# Patient Record
Sex: Male | Born: 1985 | Race: Black or African American | Hispanic: No | Marital: Single | Smoking: Never smoker
Health system: Southern US, Community
[De-identification: ages and names within clinical notes are randomized; demographics above are authoritative.]

---

## 2016-11-16 ENCOUNTER — Emergency Department (HOSPITAL_COMMUNITY): Payer: No Typology Code available for payment source

## 2016-11-16 ENCOUNTER — Emergency Department (HOSPITAL_COMMUNITY)
Admission: EM | Admit: 2016-11-16 | Discharge: 2016-11-16 | Disposition: A | Discharge status: Home / Self Care | Payer: No Typology Code available for payment source | Attending: Emergency Medicine | Admitting: Emergency Medicine

## 2016-11-16 DIAGNOSIS — M546 Pain in thoracic spine: Secondary | ICD-10-CM | POA: Insufficient documentation

## 2016-11-16 DIAGNOSIS — M25511 Pain in right shoulder: Secondary | ICD-10-CM | POA: Insufficient documentation

## 2016-11-16 DIAGNOSIS — Y999 Unspecified external cause status: Secondary | ICD-10-CM | POA: Diagnosis not present

## 2016-11-16 DIAGNOSIS — M549 Dorsalgia, unspecified: Secondary | ICD-10-CM

## 2016-11-16 DIAGNOSIS — Y92410 Unspecified street and highway as the place of occurrence of the external cause: Secondary | ICD-10-CM | POA: Diagnosis not present

## 2016-11-16 DIAGNOSIS — Y939 Activity, unspecified: Secondary | ICD-10-CM | POA: Diagnosis not present

## 2016-11-16 MED ORDER — IBUPROFEN 600 MG PO TABS: 600.0000 mg | ORAL_TABLET | Freq: Four times a day (QID) | ORAL | 0 refills | Status: DC | PRN

## 2016-11-16 MED ORDER — METHOCARBAMOL 500 MG PO TABS: 500.0000 mg | ORAL_TABLET | Freq: Every evening | ORAL | 0 refills | Status: DC | PRN

## 2016-11-16 MED ORDER — METHOCARBAMOL 500 MG PO TABS
1000.0000 mg | ORAL_TABLET | Freq: Once | ORAL | Status: AC
  Administered 2016-11-16: 1000 mg via ORAL
  Filled 2016-11-16: qty 2

## 2016-11-16 MED ORDER — IBUPROFEN 200 MG PO TABS
600.0000 mg | ORAL_TABLET | Freq: Once | ORAL | Status: AC
  Administered 2016-11-16: 600 mg via ORAL
  Filled 2016-11-16: qty 3

## 2016-11-16 NOTE — ED Notes (Signed)
History and screening reviews delayed d/t Pt being on the phone.

## 2016-11-16 NOTE — ED Notes (Signed)
Bed: WTR7 Expected date:  Expected time:  Means of arrival:  Comments: EMS 

## 2016-11-16 NOTE — ED Notes (Addendum)
Verbalized understanding discharge instructions, prescriptions, and follow up. In no acute distress.  Pt reports he will be walking home d/t weather.  Pt sts "my phone says that it's a minute walk."
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
## 2016-11-16 NOTE — Discharge Instructions (Signed)
Take anti-inflammatory medicines for the next week. Take this medicine with food. °Take muscle relaxer at bedtime to help you sleep. This medicine makes you drowsy so do not take before driving or work °Use a heating pad for sore muscles - use for 20 minutes several times a day ° °

## 2016-11-16 NOTE — ED Notes (Signed)
Pt dropped 1- 200mg  ibuprofen tablet, so a 4th tablet had to be pulled from Pyxis.

## 2018-03-21 ENCOUNTER — Other Ambulatory Visit: Payer: Self-pay

## 2018-03-21 ENCOUNTER — Encounter (HOSPITAL_COMMUNITY): Payer: Self-pay

## 2018-03-21 DIAGNOSIS — B001 Herpesviral vesicular dermatitis: Secondary | ICD-10-CM | POA: Insufficient documentation

## 2018-03-21 NOTE — ED Triage Notes (Signed)
States while taking a shower tonight he noticed bumps on right side of mouth with burning, states his scrotum is gray and it is not usual.

## 2018-03-22 ENCOUNTER — Emergency Department (HOSPITAL_COMMUNITY)
Admission: EM | Admit: 2018-03-22 | Discharge: 2018-03-22 | Disposition: A | Discharge status: Home / Self Care | Payer: Self-pay | Attending: Emergency Medicine | Admitting: Emergency Medicine

## 2018-03-22 DIAGNOSIS — B001 Herpesviral vesicular dermatitis: Secondary | ICD-10-CM

## 2018-03-22 LAB — HIV ANTIBODY (ROUTINE TESTING W REFLEX): HIV Screen 4th Generation wRfx: NONREACTIVE

## 2018-03-22 LAB — RPR: RPR: NONREACTIVE

## 2018-03-22 MED ORDER — VALACYCLOVIR HCL 500 MG PO TABS
500.0000 mg | ORAL_TABLET | Freq: Once | ORAL | Status: AC
  Administered 2018-03-22: 500 mg via ORAL
  Filled 2018-03-22: qty 1

## 2018-03-22 MED ORDER — VALACYCLOVIR HCL 1 G PO TABS: ORAL_TABLET | ORAL | 0 refills | Status: AC

## 2018-03-22 NOTE — ED Notes (Signed)
Pt given apple juice and is aware he needs a urine sample

## 2018-03-22 NOTE — ED Provider Notes (Signed)
[facility] Provider Note: Lowella Dell, MD, FACEP  CSN: [id number] MRN: [id number] ARRIVAL: 03/21/18 at 2241 ROOM: WA09/WA09   CHIEF COMPLAINT  Mouth Lesions   HISTORY OF PRESENT ILLNESS  03/22/18 3:05 AM William Pineda is a 32 y.o. male with a 1 day history of lesions around the right side of his mouth.  The lesions are burning "really bad", worse with palpation.  He has no history of cold sores.  He denies other symptoms apart from believing his scrotal skin is gray.  He would like to be tested for "all STDs".   History reviewed. No pertinent past medical history.  History reviewed. No pertinent surgical history.  History reviewed. No pertinent family history.  Social History   Tobacco Use  . Smoking status: Never Smoker  . Smokeless tobacco: Never Used  Substance Use Topics  . Alcohol use: Never    Frequency: Never  . Drug use: Never    Prior to Admission medications   Medication Sig Start Date End Date Taking? Authorizing Provider  ibuprofen (ADVIL,MOTRIN) 600 MG tablet Take 1 tablet (600 mg total) by mouth every 6 (six) hours as needed. 11/16/16   Bethel Born, PA-C  methocarbamol (ROBAXIN) 500 MG tablet Take 1 tablet (500 mg total) by mouth at bedtime and may repeat dose one time if needed. 11/16/16   Bethel Born, PA-C    Allergies Patient has no known allergies.   REVIEW OF SYSTEMS  Negative except as noted here or in the History of Present Illness.   PHYSICAL EXAMINATION  Initial Vital Signs Blood pressure 126/90, pulse (!) 58, temperature 98.5 F (36.9 C), temperature source Oral, resp. rate 18, height  (1.753 m), weight 81.6 kg (180 lb), SpO2 98 %.  Examination General: Well-developed, well-nourished male in no acute distress; appearance consistent with age of record HENT: normocephalic; atraumatic Eyes: Normal appearance Neck: supple Heart: regular rate and rhythm Lungs: clear to auscultation bilaterally Abdomen: soft;  nondistended; nontender GU: Tanner V male, circumcised; no urethral discharge; no abnormal skin lesions or discoloration seen Extremities: No deformity; full range of motion; pulses normal Neurologic: Awake, alert and oriented; motor function intact in all extremities and symmetric; no facial droop Skin: Warm and dry; right-sided perioral lesions both vesicular and macular Psychiatric: Flat affect   RESULTS  Summary of this visit's results, reviewed by myself:   EKG Interpretation  Date/Time:    Ventricular Rate:    PR Interval:    QRS Duration:   QT Interval:    QTC Calculation:   R Axis:     Text Interpretation:        Laboratory Studies: No results found for this or any previous visit (from the past 24 hour(s)). Imaging Studies: No results found.  ED COURSE and MDM  Nursing notes and initial vitals signs, including pulse oximetry, reviewed.  Vitals:   03/21/18 2249 03/21/18 2250 03/22/18 0143 03/22/18 0314  BP: 135/78  126/90 (!) 142/92  Pulse: 73  (!) 58 70  Resp: Temp: 98.5 F (36.9 C)     TempSrc: Oral     SpO2: 99%  98% 97%  Weight:  81.6 kg (180 lb)    Height:   (1.753 m)     Suspect herpes labialis and will treat for this.  Patient will be tested for STDs as requested.  PROCEDURES    ED DIAGNOSES     ICD-10-CM   1. Cold sore B00.1  Devlin Mcveigh, Jonny Ruiz, MD 03/22/18 [phone]

## 2018-03-23 LAB — GC/CHLAMYDIA PROBE AMP (~~LOC~~) NOT AT ARMC
CHLAMYDIA, DNA PROBE: NEGATIVE
NEISSERIA GONORRHEA: NEGATIVE

## 2018-08-23 ENCOUNTER — Emergency Department (HOSPITAL_COMMUNITY)
Admission: EM | Admit: 2018-08-23 | Discharge: 2018-08-24 | Disposition: A | Discharge status: Home / Self Care | Payer: Self-pay | Attending: Emergency Medicine | Admitting: Emergency Medicine

## 2018-08-23 DIAGNOSIS — N4889 Other specified disorders of penis: Secondary | ICD-10-CM | POA: Insufficient documentation

## 2018-08-23 DIAGNOSIS — Z5321 Procedure and treatment not carried out due to patient leaving prior to being seen by health care provider: Secondary | ICD-10-CM | POA: Insufficient documentation

## 2018-08-24 ENCOUNTER — Encounter (HOSPITAL_COMMUNITY): Payer: Self-pay

## 2018-08-24 ENCOUNTER — Other Ambulatory Visit: Payer: Self-pay

## 2018-08-24 LAB — BASIC METABOLIC PANEL
ANION GAP: 10 (ref 5–15)
BUN: 11 mg/dL (ref 6–20)
CHLORIDE: 106 mmol/L (ref 98–111)
CO2: 23 mmol/L (ref 22–32)
CREATININE: 0.85 mg/dL (ref 0.61–1.24)
Calcium: 9.5 mg/dL (ref 8.9–10.3)
GFR calc non Af Amer: >60 mL/min (ref >60)
Glucose, Bld: 119 mg/dL — ABNORMAL HIGH (ref 70–99)
POTASSIUM: 3.6 mmol/L (ref 3.5–5.1)
SODIUM: 139 mmol/L (ref 135–145)

## 2018-08-24 LAB — URINALYSIS, ROUTINE W REFLEX MICROSCOPIC
BILIRUBIN URINE: NEGATIVE
Glucose, UA: NEGATIVE mg/dL
Ketones, ur: NEGATIVE mg/dL
LEUKOCYTES UA: NEGATIVE
NITRITE: NEGATIVE
Protein, ur: NEGATIVE mg/dL
SPECIFIC GRAVITY, URINE: 1.03 (ref 1.005–1.030)
pH: 5 (ref 5.0–8.0)

## 2018-08-24 LAB — CBC
HCT: 45.4 % (ref 39.0–52.0)
HEMOGLOBIN: 14.9 g/dL (ref 13.0–17.0)
MCH: 29.2 pg (ref 26.0–34.0)
MCHC: 32.8 g/dL (ref 30.0–36.0)
MCV: 88.8 fL (ref 80.0–100.0)
NRBC: 0 % (ref 0.0–0.2)
Platelets: 296 10*3/uL (ref 150–400)
RBC: 5.11 MIL/uL (ref 4.22–5.81)
RDW: 11.9 % (ref 11.5–15.5)
WBC: 7.1 10*3/uL (ref 4.0–10.5)

## 2018-08-24 NOTE — ED Triage Notes (Signed)
Pt here after having bright red blood happen with an ejaculation after intercourse.  States he has never had this happen before and when he urinates there is no blood.  No burning with urination or frequency, does states there was a weird smell.

## 2018-08-24 NOTE — ED Notes (Signed)
Pt. Stated "I'm gonna have to head out because I have work in the morning". This tech told him he shpuld stay for his medical care but he declined. Moving him off the floor.

## 2019-05-02 ENCOUNTER — Other Ambulatory Visit: Payer: Self-pay

## 2019-05-02 ENCOUNTER — Ambulatory Visit (HOSPITAL_COMMUNITY)
Admission: EM | Admit: 2019-05-02 | Discharge: 2019-05-02 | Disposition: A | Discharge status: Home / Self Care | Payer: Self-pay | Attending: Family Medicine | Admitting: Family Medicine

## 2019-05-02 ENCOUNTER — Encounter (HOSPITAL_COMMUNITY): Payer: Self-pay | Admitting: Emergency Medicine

## 2019-05-02 DIAGNOSIS — S3730XA Unspecified injury of urethra, initial encounter: Secondary | ICD-10-CM | POA: Insufficient documentation

## 2019-05-02 LAB — POCT URINALYSIS DIP (DEVICE)
Bilirubin Urine: NEGATIVE
Glucose, UA: NEGATIVE mg/dL
Leukocytes,Ua: NEGATIVE
Nitrite: NEGATIVE
Protein, ur: NEGATIVE mg/dL
Specific Gravity, Urine: >=1.03 (ref 1.005–1.030)
Urobilinogen, UA: 1 mg/dL (ref 0.0–1.0)
pH: 5.5 (ref 5.0–8.0)

## 2019-05-02 NOTE — Discharge Instructions (Addendum)
Please hold off on all intercourse and monitor for bleeding and pain to gradually resolve Please follow up if pain/bleeding persisting as you may need to see urology- contact below We will check the urine for STDs

## 2019-05-02 NOTE — ED Triage Notes (Signed)
Pt states during intercourse he felt a squeezing or rubbing to his penis.  When he ejaculated he saw blood in it and then he states he saw blood in his urine after. This happened just a few hours ago.  He is still having pain to the head of his penis. He denies any other urinary symptoms.
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
## 2019-05-07 LAB — URINE CYTOLOGY ANCILLARY ONLY
Chlamydia: NEGATIVE
Neisseria Gonorrhea: NEGATIVE
Trichomonas: POSITIVE — AB

## 2019-05-09 ENCOUNTER — Telehealth (HOSPITAL_COMMUNITY): Payer: Self-pay | Admitting: Emergency Medicine

## 2019-05-09 MED ORDER — METRONIDAZOLE 500 MG PO TABS: 2000.0000 mg | ORAL_TABLET | Freq: Once | ORAL | 0 refills | Status: AC

## 2019-05-09 NOTE — Telephone Encounter (Signed)
Trichomonas is positive. Rx  for Flagyl 2 grams, once was sent to the pharmacy of record. Pt needs education to refrain from sexual intercourse for 7 days to give the medicine time to work. Sexual partners need to be notified and tested/treated. Condoms may reduce risk of reinfection. Recheck for further evaluation if symptoms are not improving.   Patient contacted and made aware of all results, all questions answered.   

## 2019-05-10 ENCOUNTER — Telehealth (HOSPITAL_COMMUNITY): Payer: Self-pay | Admitting: Emergency Medicine

## 2019-05-10 MED ORDER — METRONIDAZOLE 500 MG PO TABS: 2000.0000 mg | ORAL_TABLET | Freq: Once | ORAL | 0 refills | Status: AC

## 2019-05-10 MED ORDER — ONDANSETRON 4 MG PO TBDP: 4.0000 mg | ORAL_TABLET | Freq: Once | ORAL | 0 refills | Status: AC

## 2019-05-10 NOTE — Telephone Encounter (Signed)
Pt called asking for refill on the flagyl, states he threw up two of the pills. Okay to send to pharmacy per Lanelle Bal, will send one pill for nausea as well.

## 2020-04-04 ENCOUNTER — Emergency Department (HOSPITAL_COMMUNITY)
Admission: EM | Admit: 2020-04-04 | Discharge: 2020-04-04 | Disposition: A | Discharge status: Home / Self Care | Payer: Self-pay | Attending: Emergency Medicine | Admitting: Emergency Medicine

## 2020-04-04 ENCOUNTER — Encounter (HOSPITAL_COMMUNITY): Payer: Self-pay | Admitting: Emergency Medicine

## 2020-04-04 ENCOUNTER — Emergency Department (HOSPITAL_COMMUNITY): Payer: Self-pay

## 2020-04-04 DIAGNOSIS — M79641 Pain in right hand: Secondary | ICD-10-CM

## 2020-04-04 NOTE — Discharge Instructions (Signed)
Your xrays did not show any signs of fractures today. You are likely experiencing pain related to a soft tissue injury. Keep ace wrap on to help with compression. While at home please rest, ice, and elevate your wrist to reduce pain/swelling. You can take Ibuprofen and Tylenol as needed for pain.   If no improvement in about 1 week please follow up with Dr. Merlyn Lot for further evaluation

## 2020-04-04 NOTE — ED Triage Notes (Signed)
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

## 2020-04-04 NOTE — ED Provider Notes (Signed)
[facility]-EMERGENCY DEPT Provider Note   CSN: [id number] Arrival date & time: 04/04/20  1025     History Chief Complaint  Patient presents with  . Hand Pain    William Pineda is a 34 y.o. male who is R hand dominant presents to the ED today with complaint of gradual onset, constant, achy, R hand pain that began yesterday. Pt is a Curator and works on cars.  He states that he was working on a car yesterday and taking off nuts and bolts from the car with a tool.  He states that shortly afterwards he began having pain in the palmar aspect of his right hand.  Trauma to the hand.  He denies anything falling on it.  He tried to apply ice without relief.  He has not taken any over-the-counter medications.  He went to work today however states worsening pain and his boss wanted him to come and get checked out.  States the pain is exacerbated with full extension of all fingers spread apart.   The history is provided by the patient and medical records.       History reviewed. No pertinent past medical history.  There are no problems to display for this patient.   History reviewed. No pertinent surgical history.     No family history on file.  Social History   Tobacco Use  . Smoking status: Never Smoker  . Smokeless tobacco: Never Used  Substance Use Topics  . Alcohol use: Never  . Drug use: Never    Home Medications Prior to Admission medications   Medication Sig Start Date End Date Taking? Authorizing Provider  valACYclovir (VALTREX) 1000 MG tablet Take 1 tablet at 4 PM today. 03/22/18   Molpus, Jonny Ruiz, MD    Allergies    Patient has no known allergies.  Review of Systems   Review of Systems  Constitutional: Negative for chills and fever.  Musculoskeletal: Positive for arthralgias and joint swelling.  Skin: Negative for wound.  Neurological: Negative for weakness and numbness.    Physical Exam Updated Vital Signs BP 133/84 (BP Location: Right Arm)    Pulse 86   Temp 98.4 F (36.9 C) (Oral)   Resp 18   SpO2 98%   Physical Exam Vitals and nursing note reviewed.  Constitutional:      Appearance: He is not ill-appearing or diaphoretic.  HENT:     Head: Normocephalic and atraumatic.  Eyes:     Conjunctiva/sclera: Conjunctivae normal.  Cardiovascular:     Rate and Rhythm: Normal rate and regular rhythm.     Pulses: Normal pulses.  Pulmonary:     Effort: Pulmonary effort is normal.     Breath sounds: Normal breath sounds. No wheezing, rhonchi or rales.  Musculoskeletal:     Comments: Mild swelling noted to palmar aspect of R hand along thenar imminence with TTP. No overlying skin changes. No tenderness to wrist, MCP, PIP, and DIP joints of all fingers. Cap refill < 2 seconds. 2+ radial pulses.   Skin:    General: Skin is warm and dry.     Coloration: Skin is not jaundiced.  Neurological:     Mental Status: He is alert.     ED Results / Procedures / Treatments   Labs (all labs ordered are listed, but only abnormal results are displayed) Labs Reviewed - No data to display  EKG None  Radiology DG Wrist Complete Right  Result Date: 04/04/2020 CLINICAL DATA:  Patient was working  on a car yesterday now complaining of anterior right hand and wrist pain. EXAM: RIGHT WRIST - COMPLETE 3+ VIEW COMPARISON:  None. FINDINGS: There is no evidence of fracture or dislocation. There is no evidence of arthropathy or other focal bone abnormality. Soft tissues are unremarkable. IMPRESSION: Negative. Electronically Signed   By: Lajean Manes M.D.   On: 04/04/2020 11:31   DG Hand Complete Right  Result Date: 04/04/2020 CLINICAL DATA:  Patient was working on a car yesterday now complaining of anterior right hand and wrist pain. EXAM: RIGHT HAND - COMPLETE 3+ VIEW COMPARISON:  None. FINDINGS: There is no evidence of fracture or dislocation. There is no evidence of arthropathy or other focal bone abnormality. Soft tissues are unremarkable. IMPRESSION:  Negative. Electronically Signed   By: Lajean Manes M.D.   On: 04/04/2020 11:32    Procedures Procedures (including critical care time)  Medications Ordered in ED Medications - No data to display  ED Course  I have reviewed the triage vital signs and the nursing notes.  Pertinent labs & imaging results that were available during my care of the patient were reviewed by me and considered in my medical decision making (see chart for details).    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  Pain is worsened with extension of all fingers.  Able to make a fist without difficulty.  Range of motion intact to wrist and all fingers.  He does have some swelling and tenderness noted to the thenar eminence.  Neurovascularly intact.  X-rays of the wrist and hand were obtained prior to being seen which were negative for fractures.  I suspect likely soft tissue injury.  Will apply Ace wrap, rice therapy discussed as well as ibuprofen/Tylenol urine for pain.  Patient to be given information for orthopedist if no improvement in 1 week.  He is in agreement with plan and stable for discharge home.   This note was prepared using Dragon voice recognition software and may include unintentional dictation errors due to the inherent limitations of voice recognition software.  Final Clinical Impression(s) / ED Diagnoses Final diagnoses:  Right hand pain    Rx / DC Orders ED Discharge Orders    None       Discharge Instructions     Your xrays did not show any signs of fractures today. You are likely experiencing pain related to a soft tissue injury. Keep ace wrap on to help with compression. While at home please rest, ice, and elevate your wrist to reduce pain/swelling. You can take  Ibuprofen and Tylenol as needed for pain.   If no improvement in about 1 week please follow up with Dr. Fredna Dow for further evaluation        Eustaquio Maize, PA-C 04/04/20 [place], MD 04/04/20 1340

## 2020-06-14 IMAGING — CR DG HAND COMPLETE 3+V*R*
3 series · 3 of 3 positions shown · non-contrast
Comparison: None.

CLINICAL DATA: Patient was working on a car yesterday now
complaining of anterior right hand and wrist pain.

EXAM:
RIGHT HAND - COMPLETE 3+ VIEW

[x hand pa right]
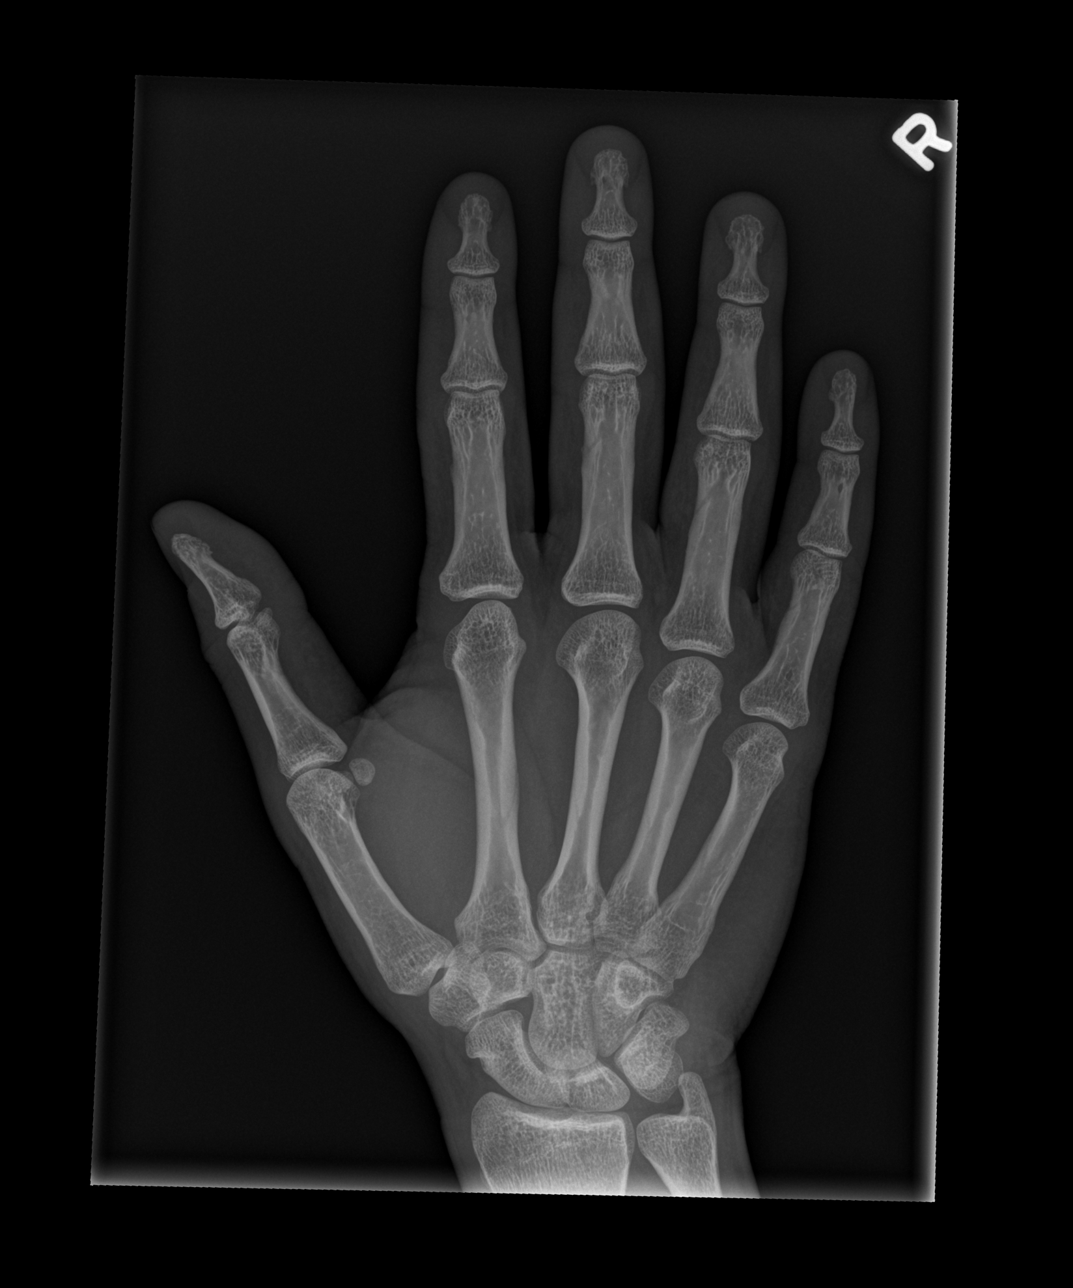

[x hand obl right]
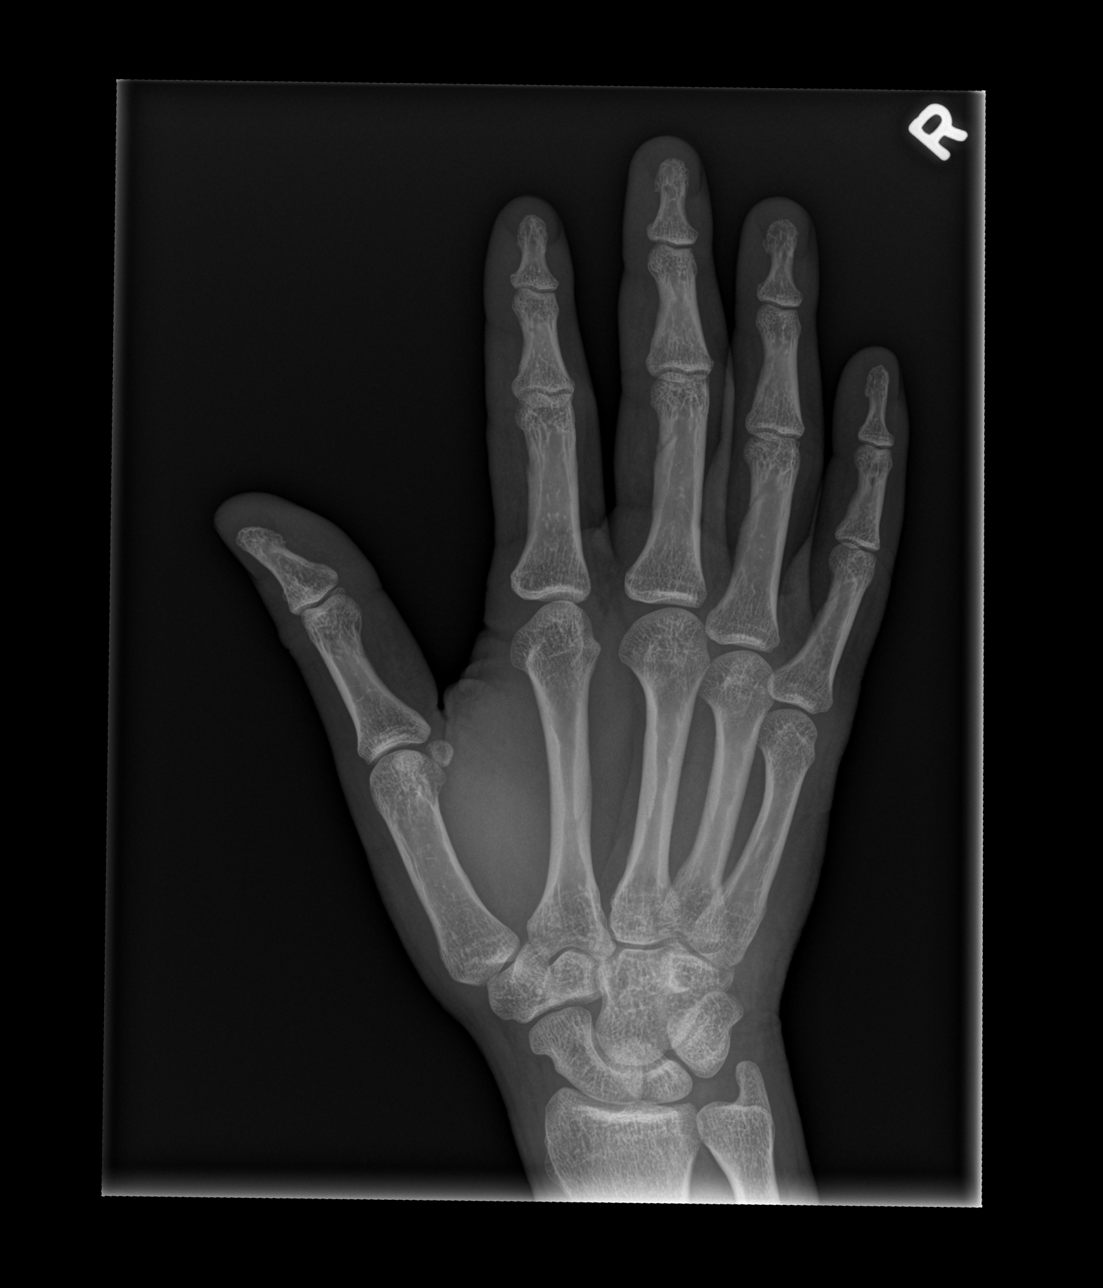

[x hand lat right]
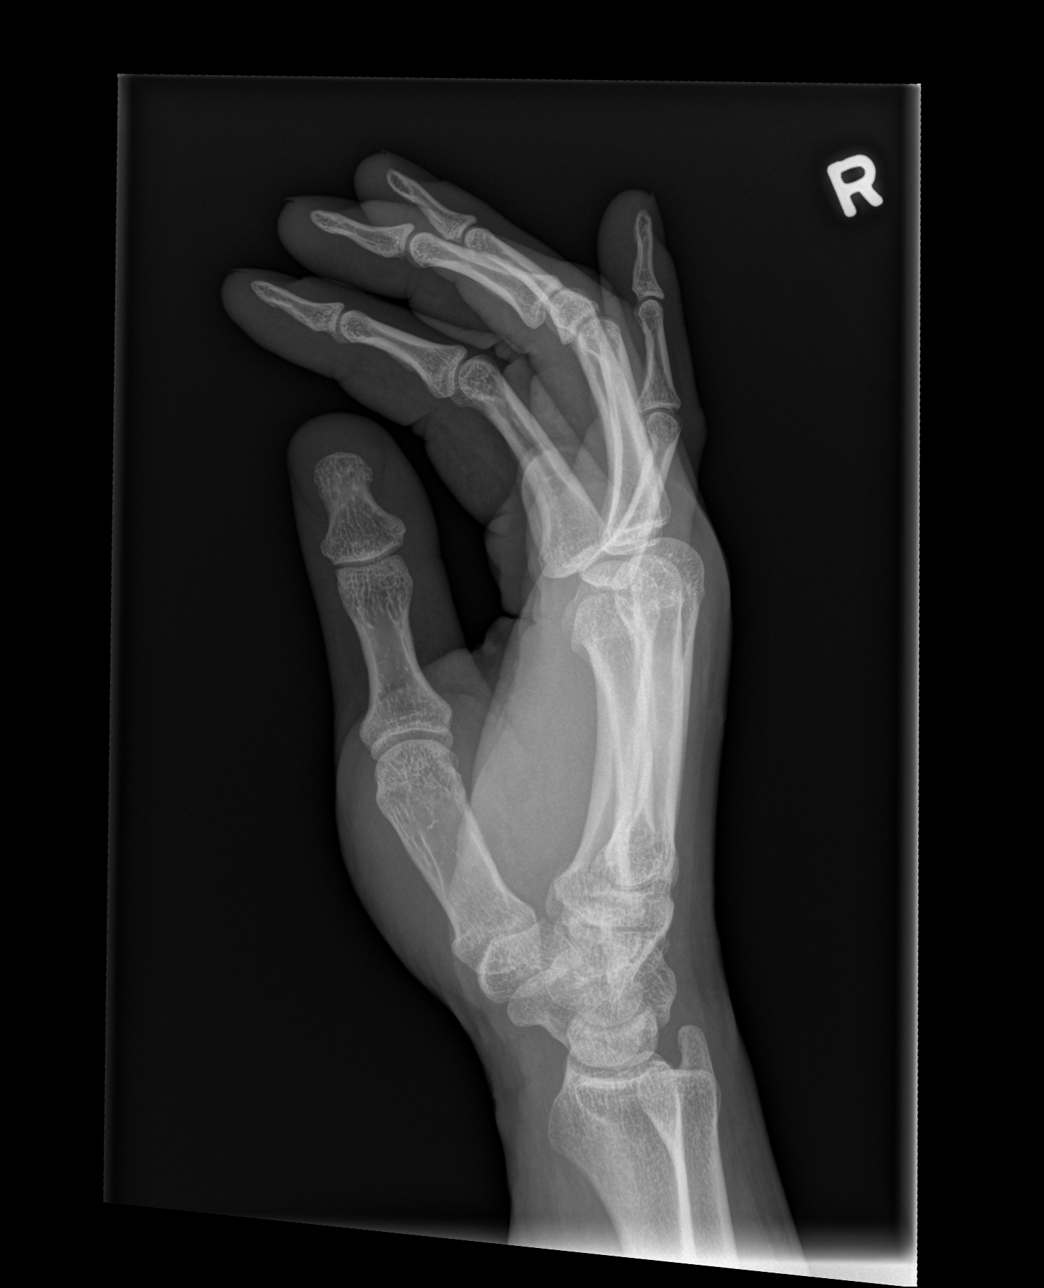

[3 of 3 positions shown; findings below may reference images not displayed]

FINDINGS: There is no evidence of fracture or dislocation. There is no
evidence of arthropathy or other focal bone abnormality. Soft
tissues are unremarkable.
IMPRESSION: Negative.

## 2020-06-14 IMAGING — CR DG WRIST COMPLETE 3+V*R*
4 series · 4 of 4 positions shown · non-contrast
Comparison: None.

CLINICAL DATA: Patient was working on a car yesterday now
complaining of anterior right hand and wrist pain.

EXAM:
RIGHT WRIST - COMPLETE 3+ VIEW

[x wrist lat right]
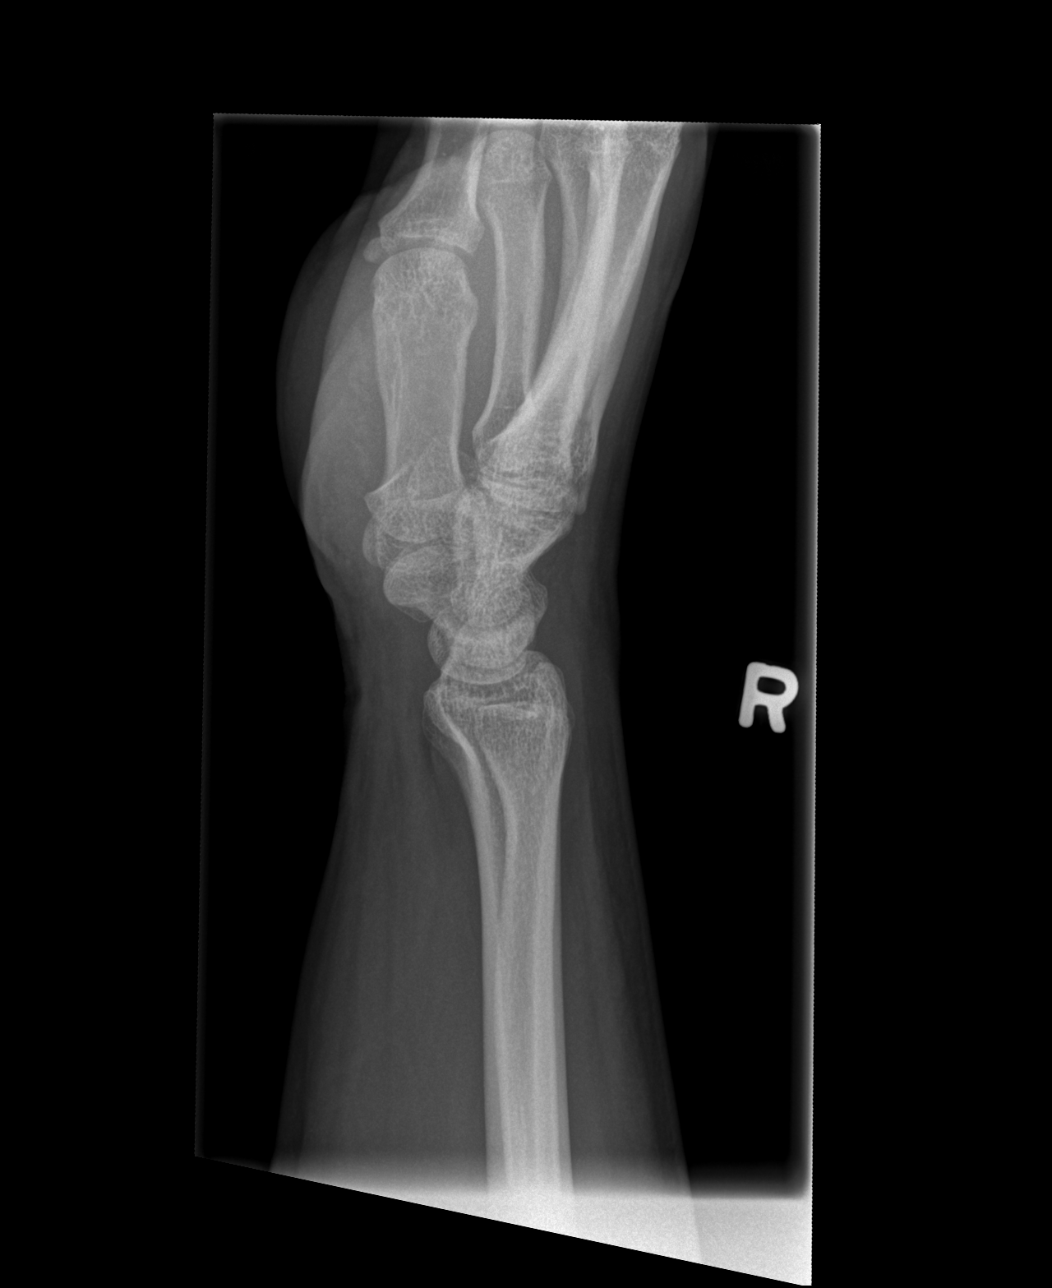

[x wrist obl right]
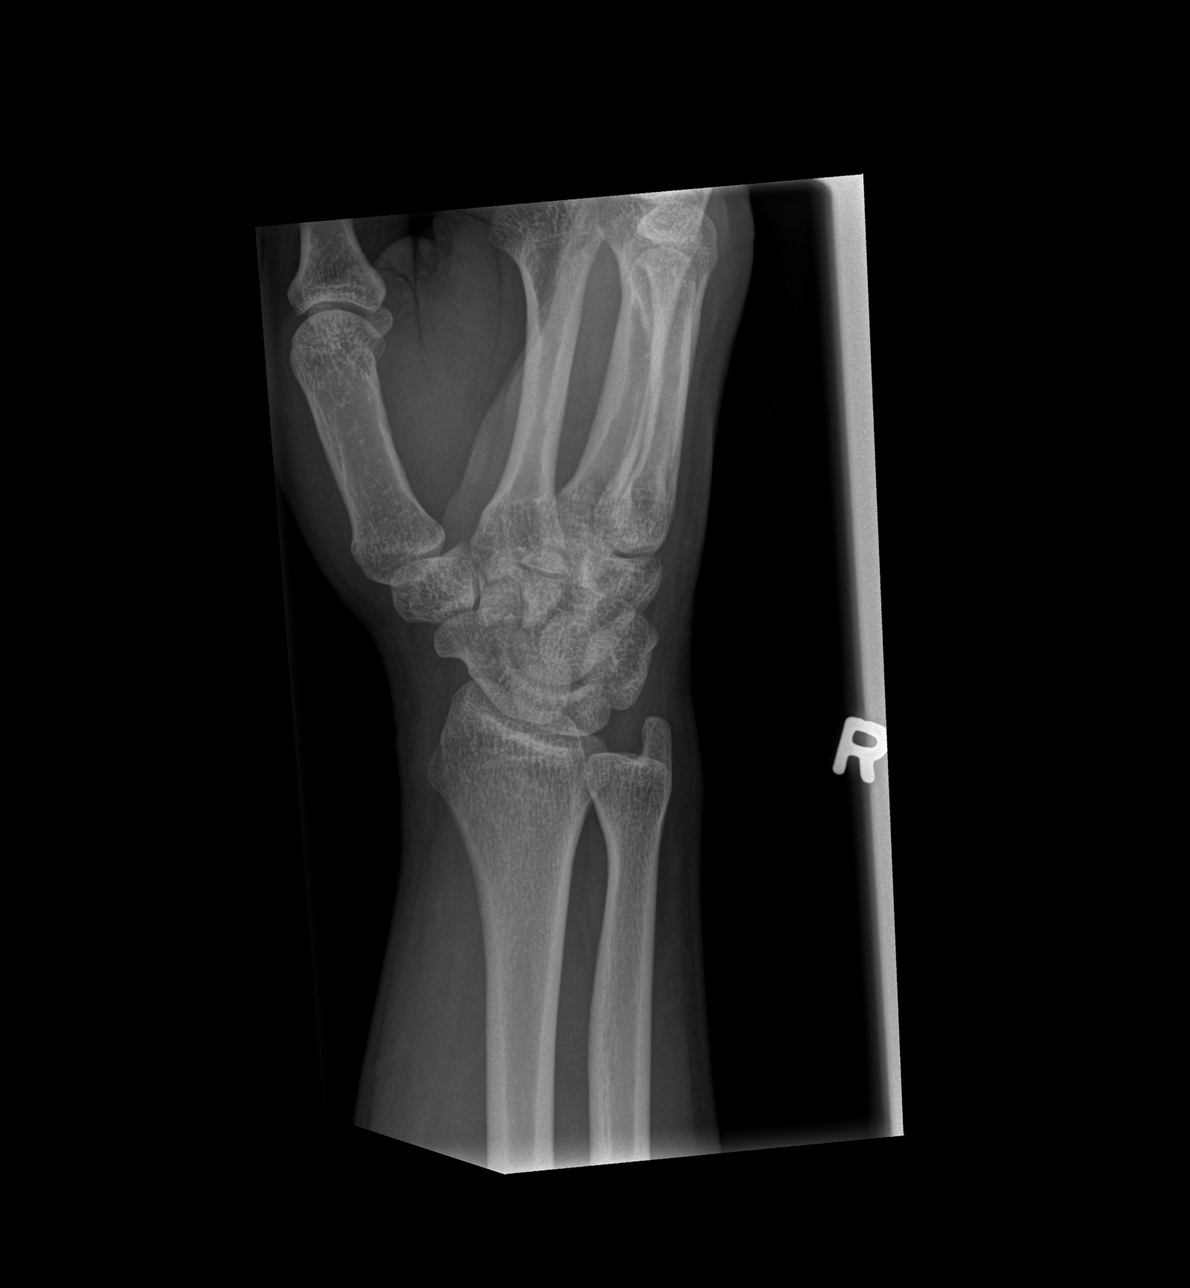

[x wrist pa right]
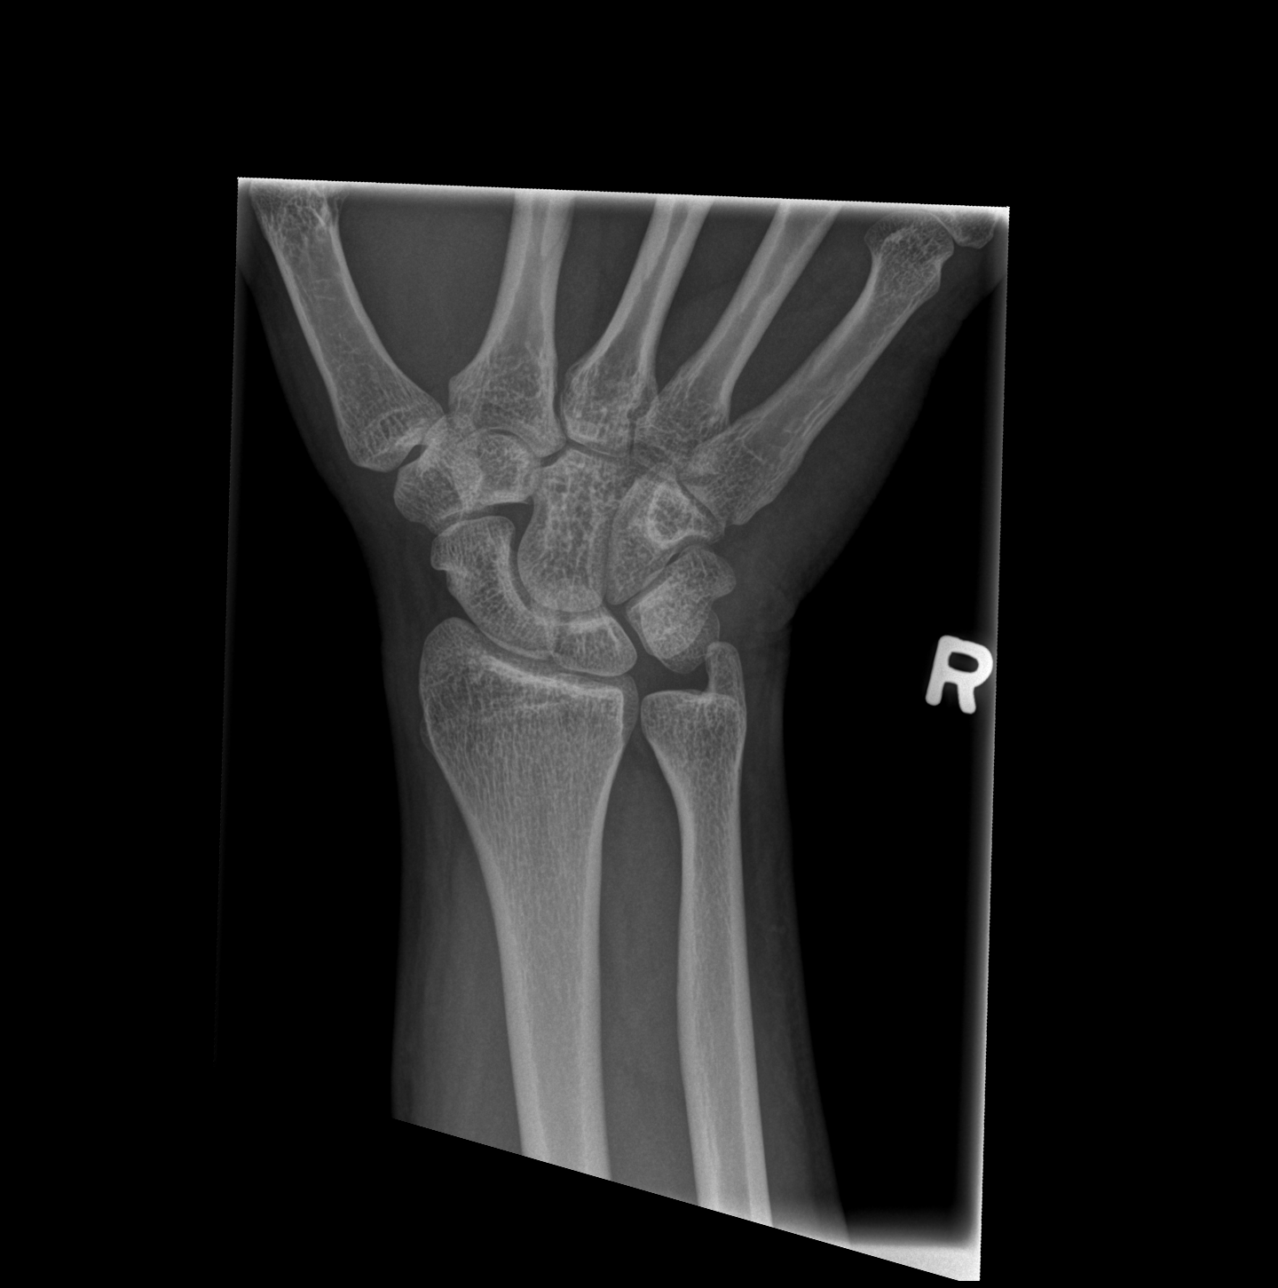

[x wrist navicular view right]
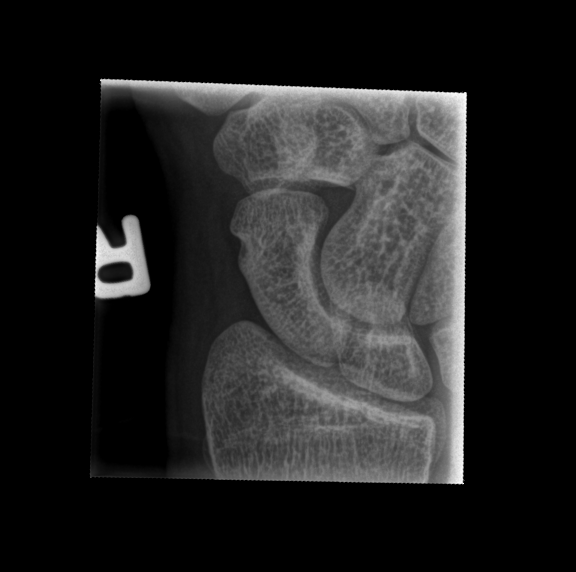

[4 of 4 positions shown; findings below may reference images not displayed]

FINDINGS: There is no evidence of fracture or dislocation. There is no
evidence of arthropathy or other focal bone abnormality. Soft
tissues are unremarkable.
IMPRESSION: Negative.

## 2020-06-27 ENCOUNTER — Encounter (HOSPITAL_COMMUNITY): Payer: Self-pay | Admitting: Student

## 2020-06-27 ENCOUNTER — Emergency Department (HOSPITAL_COMMUNITY)
Admission: EM | Admit: 2020-06-27 | Discharge: 2020-06-29 | Disposition: A | Discharge status: Home / Self Care | Payer: Self-pay | Attending: Emergency Medicine | Admitting: Emergency Medicine

## 2020-06-27 ENCOUNTER — Other Ambulatory Visit: Payer: Self-pay

## 2020-06-27 DIAGNOSIS — Z046 Encounter for general psychiatric examination, requested by authority: Secondary | ICD-10-CM

## 2020-06-27 DIAGNOSIS — R451 Restlessness and agitation: Secondary | ICD-10-CM | POA: Insufficient documentation

## 2020-06-27 DIAGNOSIS — R45851 Suicidal ideations: Secondary | ICD-10-CM | POA: Insufficient documentation

## 2020-06-27 DIAGNOSIS — Z20822 Contact with and (suspected) exposure to covid-19: Secondary | ICD-10-CM | POA: Insufficient documentation

## 2020-06-27 DIAGNOSIS — F4325 Adjustment disorder with mixed disturbance of emotions and conduct: Secondary | ICD-10-CM

## 2020-06-27 DIAGNOSIS — F4320 Adjustment disorder, unspecified: Secondary | ICD-10-CM | POA: Insufficient documentation

## 2020-06-27 LAB — COMPREHENSIVE METABOLIC PANEL
ALT: 15 U/L (ref 0–44)
AST: 24 U/L (ref 15–41)
Albumin: 4.6 g/dL (ref 3.5–5.0)
Alkaline Phosphatase: 69 U/L (ref 38–126)
Anion gap: 10 (ref 5–15)
BUN: 9 mg/dL (ref 6–20)
CO2: 22 mmol/L (ref 22–32)
Calcium: 9.6 mg/dL (ref 8.9–10.3)
Chloride: 107 mmol/L (ref 98–111)
Creatinine, Ser: 0.74 mg/dL (ref 0.61–1.24)
GFR calc Af Amer: >60 mL/min (ref >60)
GFR calc non Af Amer: >60 mL/min (ref >60)
Glucose, Bld: 92 mg/dL (ref 70–99)
Potassium: 3.8 mmol/L (ref 3.5–5.1)
Sodium: 139 mmol/L (ref 135–145)
Total Bilirubin: 1 mg/dL (ref 0.3–1.2)
Total Protein: 8.3 g/dL — ABNORMAL HIGH (ref 6.5–8.1)

## 2020-06-27 LAB — CBC
HCT: 47.7 % (ref 39.0–52.0)
Hemoglobin: 16.4 g/dL (ref 13.0–17.0)
MCH: 29.9 pg (ref 26.0–34.0)
MCHC: 34.4 g/dL (ref 30.0–36.0)
MCV: 86.9 fL (ref 80.0–100.0)
Platelets: 265 10*3/uL (ref 150–400)
RBC: 5.49 MIL/uL (ref 4.22–5.81)
RDW: 12.5 % (ref 11.5–15.5)
WBC: 5.6 10*3/uL (ref 4.0–10.5)
nRBC: 0 % (ref 0.0–0.2)

## 2020-06-27 LAB — ACETAMINOPHEN LEVEL: Acetaminophen (Tylenol), Serum: <10 ug/mL — ABNORMAL LOW (ref 10–30)

## 2020-06-27 LAB — ETHANOL: Alcohol, Ethyl (B): <10 mg/dL (ref <10)

## 2020-06-27 LAB — RAPID URINE DRUG SCREEN, HOSP PERFORMED
Amphetamines: NOT DETECTED
Barbiturates: NOT DETECTED
Benzodiazepines: NOT DETECTED
Cocaine: NOT DETECTED
Opiates: NOT DETECTED
Tetrahydrocannabinol: NOT DETECTED

## 2020-06-27 LAB — SARS CORONAVIRUS 2 BY RT PCR (HOSPITAL ORDER, PERFORMED IN ~~LOC~~ HOSPITAL LAB): SARS Coronavirus 2: NEGATIVE

## 2020-06-27 LAB — SALICYLATE LEVEL: Salicylate Lvl: <7 mg/dL — ABNORMAL LOW (ref 7.0–30.0)

## 2020-06-27 MED ORDER — ACETAMINOPHEN 325 MG PO TABS: 650.0000 mg | ORAL_TABLET | ORAL | Status: DC | PRN

## 2020-06-27 MED ORDER — ALUM & MAG HYDROXIDE-SIMETH 200-200-20 MG/5ML PO SUSP: 30.0000 mL | Freq: Four times a day (QID) | ORAL | Status: DC | PRN

## 2020-06-27 MED ORDER — ONDANSETRON HCL 4 MG PO TABS: 4.0000 mg | ORAL_TABLET | Freq: Three times a day (TID) | ORAL | Status: DC | PRN

## 2020-06-27 MED ORDER — LORAZEPAM 1 MG PO TABS
1.0000 mg | ORAL_TABLET | Freq: Once | ORAL | Status: AC
  Administered 2020-06-27: 1 mg via ORAL
  Filled 2020-06-27: qty 1

## 2020-06-27 MED ORDER — ZOLPIDEM TARTRATE 5 MG PO TABS: 5.0000 mg | ORAL_TABLET | Freq: Every evening | ORAL | Status: DC | PRN

## 2020-06-27 NOTE — BH Assessment (Signed)
Case was staffed with Money NP who recommended patient be observed and monitored.  

## 2020-06-27 NOTE — ED Notes (Signed)
Pt has dressed down and belongings are locked up. This included a pair of adidas shoes and black pants that contained some "pocket change"
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
## 2020-06-28 DIAGNOSIS — F4325 Adjustment disorder with mixed disturbance of emotions and conduct: Secondary | ICD-10-CM

## 2020-06-28 MED ORDER — TRAZODONE HCL 100 MG PO TABS: 50.0000 mg | ORAL_TABLET | Freq: Every evening | ORAL | Status: DC | PRN

## 2020-06-28 MED ORDER — HALOPERIDOL LACTATE 5 MG/ML IJ SOLN: 5.0000 mg | Freq: Three times a day (TID) | INTRAMUSCULAR | Status: DC | PRN

## 2020-06-28 MED ORDER — HALOPERIDOL 5 MG PO TABS: 5.0000 mg | ORAL_TABLET | Freq: Three times a day (TID) | ORAL | Status: DC | PRN

## 2020-06-28 NOTE — ED Notes (Signed)
Patient resting quietly, no requests or complaints at this time

## 2020-06-28 NOTE — Consult Note (Addendum)
Telepsych Consultation   Reason for Consult:  Suicidal ideations, behavior Referring Physician:  EDP Location of Patient: [facility] Location of Provider: Other: virtual  Patient Identification: William Pineda MRN:  [id number] Principal Diagnosis: AdjustKyngston Pickelsimersorder with mixed disturbance of emotions and conduct Diagnosis:  Principal Problem:   Adjustment disorder with mixed disturbance of emotions and conduct   Total Time spent with patient: 30 minutes  Subjective:   William Pineda is a 34 y.o. male patient who presented to [facility] accompanied by the police IVC for suicidal ideations and behaviors after having a argument with his roommate.  When asked why he is here today, he is very guarded and looks away from the camera. The patient tells me, "I don't even want to repeat it.  I said I wanted suicide by cop. I said I didn't care if I went to heaven or hell."  He lists provocative factors as an argument with his roommate and acknowledges he did not make the best choice.  He states he no longer feels this way and states he wants to go home to pay his bills.  However, nothing has changed with his home environment and his roommate still lives there.  He voices his discontent with being hospitalized and although he answers questions appropriately, it is apparent he is frustrated and trying to keep his emotions intact.   In addition, he does not state plans have been worked out regarding his roommates financial obligations.  Albeit, the patient states he feels better today after having a cool off period.  He denies suicidal or homicidal ideations, intent or plan; denies audible or visual hallucinations.  He denies mental health diagnosis, prior history for self harm or mutilating behaviors; inpatient psychiatric hospitalizations.  He also denies legal concerns or problems with violent acts towards others.  He I not assigned to outpatient counseling and is psychotropic medication naive.  I have also reviewed the [facility]  counseling assessment notes, he collaborates most of the information previously obtained. Per nursing notes, he has been cooperatives with the nursing staff.     HPI:  Per EDP note on arrival 06/27/2020@1430  William Pineda is a 34 y.o. male without significant past medical history who presents to the emergency department under IVC.  Patient is escorted by [employer].  Per police patient cut off the electricity and water to his home, he in his room he had an altercation, police arrived on scene and patient stated he did not care if he went to have it or how, he asked the police to kill him and also threatened to attack police.  Patient admits that he did statements about suicide.  He states he no longer feels suicidal, he states he is irritated that he is here.  He states he has been behind on rent and is at a low point in his life.  No other alleviating or aggravating factors.  He denies hallucinations or homicidal ideation.  He denies any pain.  He states he drinks alcohol occasionally, he denies drug use.  No alcohol today.   Past Psychiatric History: denies  Risk to Self:   Risk to Others:   Prior Inpatient Therapy:   Prior Outpatient Therapy:    Past Medical History: History reviewed. No pertinent past medical history. History reviewed. No pertinent surgical history. Family History: History reviewed. No pertinent family history. Family Psychiatric  History: unknown Social History:  Social History   Substance and Sexual Activity  Alcohol Use Never     Social History  Substance and Sexual Activity  Drug Use Never    Social History   Socioeconomic History   Marital status: Single    Spouse name: Not on file   Number of children: Not on file   Years of education: Not on file   Highest education level: Not on file  Occupational History   Not on file  Tobacco Use   Smoking status: Never Smoker   Smokeless tobacco: Never Used  Substance and Sexual Activity   Alcohol use: Never   Drug use:  Never   Sexual activity: Not on file  Other Topics Concern   Not on file  Social History Narrative   Not on file   Social Determinants of Health   Financial Resource Strain:    Difficulty of Paying Living Expenses: Not on file  Food Insecurity:    Worried About Running Out of Food in the Last Year: Not on file   Ran Out of Food in the Last Year: Not on file  Transportation Needs:    Lack of Transportation (Medical): Not on file   Lack of Transportation (Non-Medical): Not on file  Physical Activity:    Days of Exercise per Week: Not on file   Minutes of Exercise per Session: Not on file  Stress:    Feeling of Stress : Not on file  Social Connections:    Frequency of Communication with Friends and Family: Not on file   Frequency of Social Gatherings with Friends and Family: Not on file   Attends Religious Services: Not on file   Active Member of Clubs or Organizations: Not on file   Attends BankerClub or Organization Meetings: Not on file   Marital Status: Not on file   Additional Social History:    Allergies:  No Known Allergies  Labs:  Results for orders placed or performed during the hospital encounter of 06/27/20 (from the past 48 hour(s))  Comprehensive metabolic panel     Status: Abnormal   Collection Time: 06/27/20  3:37 PM  Result Value Ref Range   Sodium 139 135 - 145 mmol/L   Potassium 3.8 3.5 - 5.1 mmol/L   Chloride 107 98 - 111 mmol/L   CO2 22 22 - 32 mmol/L   Glucose, Bld 92 70 - 99 mg/dL    Comment: Glucose reference range applies only to samples taken after fasting for at least 8 hours.   BUN 9 6 - 20 mg/dL   Creatinine, Ser 1.610.74 0.61 - 1.24 mg/dL   Calcium 9.6 8.9 - 09.610.3 mg/dL   Total Protein 8.3 (H) 6.5 - 8.1 g/dL   Albumin 4.6 3.5 - 5.0 g/dL   AST 24 15 - 41 U/L   ALT 15 0 - 44 U/L   Alkaline Phosphatase 69 38 - 126 U/L   Total Bilirubin 1.0 0.3 - 1.2 mg/dL   GFR calc non Af Amer >60 >60 mL/min   GFR calc Af Amer >60 >60 mL/min   Anion gap 10 5 - 15     Comment: Performed at [facility], [address] [place], [place] [address]  Salicylate level     Status: Abnormal   Collection Time: 06/27/20  3:37 PM  Result Value Ref Range   Salicylate Lvl <7.0 (L) 7.0 - 30.0 mg/dL    Comment: Performed at [facility], [address] [place], [place] [address]  Acetaminophen level     Status: Abnormal   Collection Time: 06/27/20  3:37 PM  Result Value Ref Range   Acetaminophen (Tylenol), Serum <10 (L) 10 - 30 ug/mL    Comment: (NOTE) Therapeutic concentrations vary significantly. A range of 10-30 ug/mL  may be an effective concentration for many patients. However, some  are best treated at concentrations outside of this range. Acetaminophen concentrations >150 ug/mL at 4 hours after ingestion  and >50 ug/mL at 12 hours after ingestion are often associated with  toxic reactions.  Performed at [facility], [address], [place], [place] [address]   Ethanol     Status: None   Collection Time: 06/27/20  3:37 PM  Result Value Ref Range   Alcohol, Ethyl (B) <10 <10 mg/dL    Comment: (NOTE) Lowest detectable limit for serum alcohol is 10 mg/dL.  For medical purposes only. Performed at [facility], [address], [place], [place] [address]   CBC     Status: None   Collection Time: 06/27/20  3:37 PM  Result Value Ref Range   WBC 5.6 4.0 - 10.5 K/uL   RBC 5.49 4.22 - 5.81 MIL/uL   Hemoglobin 16.4 13.0 - 17.0 g/dL   HCT 93.9 39 - 52 %   MCV 86.9 80.0 - 100.0 fL   MCH 29.9 26.0 - 34.0 pg   MCHC 34.4 30.0 - 36.0 g/dL   RDW 03.0 09.2 - 33.0 %   Platelets 265 150 - 400 K/uL   nRBC 0.0 0.0 - 0.2 %    Comment: Performed at [facility], [address], [place], [place] [address]  SARS Coronavirus 2 by RT PCR (hospital order, performed in [facility] hospital lab) Nasopharyngeal Nasopharyngeal Swab     Status: None   Collection Time: 06/27/20   3:37 PM   Specimen: Nasopharyngeal Swab  Result Value Ref Range   SARS Coronavirus 2 NEGATIVE NEGATIVE    Comment: (NOTE) SARS-CoV-2 target nucleic acids are NOT DETECTED.  The SARS-CoV-2 RNA is generally detectable in upper and lower respiratory specimens during the acute phase of infection. The lowest concentration of SARS-CoV-2 viral copies this assay can detect is 250 copies / mL. A negative result does not preclude SARS-CoV-2 infection and should not be used as the sole basis for treatment or other patient management decisions.  A negative result may occur with improper specimen collection / handling, submission of specimen other than nasopharyngeal swab, presence of viral mutation(s) within the areas targeted by this assay, and inadequate number of viral copies (<250 copies / mL). A negative result must be combined with clinical observations, patient history, and epidemiological information.  Fact Sheet for Patients:   BoilerBrush.com.cy  Fact Sheet for Healthcare Providers: https://pope.com/  This test is not yet approved or  cleared by the [place] FDA and has been authorized for detection and/or diagnosis of SARS-CoV-2 by FDA under an Emergency Use Authorization (EUA).  This EUA will remain in effect (meaning this test can be used) for the duration of the COVID-19 declaration under Section 564(b)(1) of the Act, 21 U.S.C. section 360bbb-3(b)(1), unless the authorization is terminated or revoked sooner.  Performed at [facility], [address], [place], [place] [address]   Urine rapid drug screen (hosp performed)     Status: None   Collection Time: 06/27/20  6:00 PM  Result Value Ref Range   Opiates NONE DETECTED NONE DETECTED   Cocaine NONE DETECTED NONE DETECTED   Benzodiazepines NONE DETECTED NONE DETECTED   Amphetamines NONE DETECTED NONE DETECTED  Tetrahydrocannabinol NONE DETECTED NONE  DETECTED   Barbiturates NONE DETECTED NONE DETECTED    Comment: (NOTE) DRUG SCREEN FOR MEDICAL PURPOSES ONLY.  IF CONFIRMATION IS NEEDED FOR ANY PURPOSE, NOTIFY LAB WITHIN 5 DAYS.  LOWEST DETECTABLE LIMITS FOR URINE DRUG SCREEN Drug Pineda                     Cutoff (ng/mL) Amphetamine and metabolites    1000 Barbiturate and metabolites    200 Benzodiazepine                 200 Tricyclics and metabolites     300 Opiates and metabolites        300 Cocaine and metabolites        300 THC                            50 Performed at [facility], [address], [place], [place] [address]     Medications:  Current Facility-Administered Medications  Medication Dose Route Frequency Provider Last Rate Last Admin   acetaminophen (TYLENOL) tablet 650 mg  650 mg Oral Q4H PRN Petrucelli, Samantha R, PA-C       alum & mag hydroxide-simeth (MAALOX/MYLANTA) 200-200-20 MG/5ML suspension 30 mL  30 mL Oral Q6H PRN Petrucelli, Samantha R, PA-C       haloperidol (HALDOL) tablet 5 mg  5 mg Oral Q8H PRN Ophelia Shoulder E, NP       Or   haloperidol lactate (HALDOL) injection 5 mg  5 mg Intramuscular Q8H PRN Ophelia Shoulder E, NP       ondansetron (ZOFRAN) tablet 4 mg  4 mg Oral Q8H PRN Petrucelli, Samantha R, PA-C       traZODone (DESYREL) tablet 50 mg  50 mg Oral QHS PRN Chales Abrahams, NP       Current Outpatient Medications  Medication Sig Dispense Refill   valACYclovir (VALTREX) 1000 MG tablet Take 1 tablet at 4 PM today. (Patient not taking: Reported on 06/27/2020) 1 tablet 0    Musculoskeletal: Strength & Muscle Tone: within normal limits Gait & Station:  unable to assess, did not see patient stand Patient leans:  unable to assess, did not see patient stand  Psychiatric Specialty Exam: Physical Exam Neurological:     Mental Status: He is alert.     Review of Systems  Psychiatric/Behavioral: Positive for dysphoric mood, sleep disturbance and suicidal ideas (present on  admission, patient minimizes). Negative for hallucinations.    Blood pressure 127/78, pulse 69, temperature 98.7 F (37.1 C), temperature source Oral, resp. rate 18, height 5\' 6"  (1.676 m), weight 77.1 kg, SpO2 100 %.Body mass index is 27.44 kg/m.  General Appearance: Guarded  Eye Contact:  Fair  Speech:  Clear and Coherent and Normal Rate  Volume:  Normal  Mood:  Dysphoric and Irritable  Affect:  Congruent and Restricted  Thought Process:  Coherent and Goal Directed  Orientation:  Full (Time, Place, and Person)  Thought Content:  Rumination, on leaving the hospital to "pay my bills"  Suicidal Thoughts:  Yes.  with intent/plan, present on admission, but patient minimizes  Homicidal Thoughts:  No  Memory:  Immediate;   Good Recent;   Good Remote;   Good  Judgement:  Impaired  Insight:  Fair  Psychomotor Activity:  Normal, escalating when informed of additional 24 hours evaluation recommendations  Concentration:  Concentration: Fair and Attention Span: Fair  Recall:  Dudley Major of Knowledge:  Fair  Language:  Good  Akathisia:  NA  Handed:  Right  AIMS (if indicated):     Assets:  Communication Skills Housing  ADL's:  Intact  Cognition:  WNL  Sleep:   fair     Treatment Plan Summary: Daily contact with patient to assess and evaluate symptoms and progress in treatment and Medication management  Disposition:  The patient presented to [facility] <24 hours prior with suicidal ideations with intent "suicide by cop" after an argument with his roommate.  Although he states his mood has improved today, he still appears irritated and wants to be discharged today.  His presentation, impaired judgement and impulsiveness continue to pose safety concerns warranting additional 24 hours observation for safety.  He can be re-evaluated in the AM for suitability for discharge.    Medications: Haloperidol 5mg  po q 8 hours prn, agitation.  Can administer IM if he cannot tolerate PO.   Trazodone 50mg   qhs prn for sleep concerns  This service was provided via telemedicine using a 2-way, interactive audio and video technology.  Names of all persons participating in this telemedicine service and their role in this encounter. Name: Lopez Dentinger Role: Patient  Name: Role: PMHNP   This information was discussed with EDP.    Rayann Heman, NP 06/28/2020 1:06 PM Patient seen face-to-face for psychiatric evaluation, chart reviewed and case discussed with the physician extender and developed treatment plan. Reviewed the information documented and agree with the treatment plan. Chales Abrahams, MD

## 2020-06-29 NOTE — Consult Note (Signed)
Telepsych Consultation   Reason for Consult:  Suicidal comment Referring Physician:  EDP Location of Patient:  Location of Provider: [facility]  Patient Identification: William Pineda MRN:  [id number] Principal Diagnosis: Adjustment disorder with mixed disturbance of emotions and conduct Diagnosis:  Principal Problem:   Adjustment disorder with mixed disturbance of emotions and conduct   Total Time spent with patient: 30 minutes  Subjective:   William Pineda is a 34 y.o. male patient reports today that he is doing fine.  He denies any suicidal or homicidal ideations and denies any hallucinations.  Patient reports that he is no longer angry at his roommate and states that the roommate is moving out within the next week.  He states right now his biggest concerns is returning to work and getting his rent paid before he gets an eviction notice or has to pay because he gets placed on an eviction last and has to pay more [employer] Stults to be able to stay.  The patient states that he is not going to argue with the roommate.  Attempted to get collateral but was unable to obtain at this time.  HPI: Patient is a 34 year old male that presented to the ED under IVC after an argument with his roommate because the roommate would not help assist with paying the bills appropriately.  The patient turned off the water and the power and this ensued an argument.  The roommate contacted police during the argument and when the police arrived the patient was upset and told the police to shoot him.  Patient was brought to the ED for evaluation.  The patient remained for 2 days emergency department and continued denying any suicidal or homicidal ideations and denies any hallucinations.  Patient reported that this was just out of anger when he was mad in the day and was upset that the police were called because his roommate would not pay the bills.    Patient seen by this provider via telepsych and I have consulted with  Dr. Lucianne Muss.  Patient is pleasant, calm, cooperative.  Patient has a cooperative affect and good eye contact.  Patient has not been agitated or aggressive and has not shown any threatening behaviors.  Patient has been cooperative, calm, and compliant.  Patient is future oriented on getting his repeats when he continues to have a place to stay as well as getting back to work.The patient will be offered resources for follow-up at [facility].  At this time the patient does not meet inpatient psychiatric treatment criteria.  Patient's IVC can be rescinded.  Past Psychiatric History: None reported  Risk to Self:   Risk to Others:   Prior Inpatient Therapy:   Prior Outpatient Therapy:    Past Medical History: History reviewed. No pertinent past medical history. History reviewed. No pertinent surgical history. Family History: History reviewed. No pertinent family history. Family Psychiatric  History: None reported Social History:  Social History   Substance and Sexual Activity  Alcohol Use Never     Social History   Substance and Sexual Activity  Drug Use Never    Social History   Socioeconomic History   Marital status: Single    Spouse name: Not on file   Number of children: Not on file   Years of education: Not on file   Highest education level: Not on file  Occupational History   Not on file  Tobacco Use   Smoking status: Never Smoker   Smokeless tobacco: Never Used  Substance and Sexual Activity   Alcohol use: Never   Drug use: Never   Sexual activity: Not on file  Other Topics Concern   Not on file  Social History Narrative   Not on file   Social Determinants of Health   Financial Resource Strain:    Difficulty of Paying Living Expenses: Not on file  Food Insecurity:    Worried About Running Out of Food in the Last Year: Not on file   Ran Out of Food in the Last Year: Not on file  Transportation Needs:    Lack of Transportation  (Medical): Not on file   Lack of Transportation (Non-Medical): Not on file  Physical Activity:    Days of Exercise per Week: Not on file   Minutes of Exercise per Session: Not on file  Stress:    Feeling of Stress : Not on file  Social Connections:    Frequency of Communication with Friends and Family: Not on file   Frequency of Social Gatherings with Friends and Family: Not on file   Attends Religious Services: Not on file   Active Member of Clubs or Organizations: Not on file   Attends Banker Meetings: Not on file   Marital Status: Not on file   Additional Social History:    Allergies:  No Known Allergies  Labs:  Results for orders placed or performed during the hospital encounter of 06/27/20 (from the past 48 hour(s))  Comprehensive metabolic panel     Status: Abnormal   Collection Time: 06/27/20  3:37 PM  Result Value Ref Range   Sodium 139 135 - 145 mmol/L   Potassium 3.8 3.5 - 5.1 mmol/L   Chloride 107 98 - 111 mmol/L   CO2 22 22 - 32 mmol/L   Glucose, Bld 92 70 - 99 mg/dL    Comment: Glucose reference range applies only to samples taken after fasting for at least 8 hours.   BUN 9 6 - 20 mg/dL   Creatinine, Ser 4.69 0.61 - 1.24 mg/dL   Calcium 9.6 8.9 - 62.9 mg/dL   Total Protein 8.3 (H) 6.5 - 8.1 g/dL   Albumin 4.6 3.5 - 5.0 g/dL   AST 24 15 - 41 U/L   ALT 15 0 - 44 U/L   Alkaline Phosphatase 69 38 - 126 U/L   Total Bilirubin 1.0 0.3 - 1.2 mg/dL   GFR calc non Af Amer >60 >60 mL/min   GFR calc Af Amer >60 >60 mL/min   Anion gap 10 5 - 15    Comment: Performed at [facility], [address], [place], [place] [address]  Salicylate level     Status: Abnormal   Collection Time: 06/27/20  3:37 PM  Result Value Ref Range   Salicylate Lvl <7.0 (L) 7.0 - 30.0 mg/dL    Comment: Performed at [facility], [address] [place], [place] [address]  Acetaminophen level     Status: Abnormal   Collection Time:  06/27/20  3:37 PM  Result Value Ref Range   Acetaminophen (Tylenol), Serum <10 (L) 10 - 30 ug/mL    Comment: (NOTE) Therapeutic concentrations vary significantly. A range of 10-30 ug/mL  may be an effective concentration for many patients. However, some  are best treated at concentrations outside of this range. Acetaminophen concentrations >150 ug/mL at 4 hours after ingestion  and >50 ug/mL at 12 hours after ingestion are often associated with  toxic reactions.  Performed at  [facility], [address] [place], [place] [address]   Ethanol     Status: None   Collection Time: 06/27/20  3:37 PM  Result Value Ref Range   Alcohol, Ethyl (B) <10 <10 mg/dL    Comment: (NOTE) Lowest detectable limit for serum alcohol is 10 mg/dL.  For medical purposes only. Performed at [facility], [address] [place], [place] [address]   CBC     Status: None   Collection Time: 06/27/20  3:37 PM  Result Value Ref Range   WBC 5.6 4.0 - 10.5 K/uL   RBC 5.49 4.22 - 5.81 MIL/uL   Hemoglobin 16.4 13.0 - 17.0 g/dL   HCT 01.7 39 - 52 %   MCV 86.9 80.0 - 100.0 fL   MCH 29.9 26.0 - 34.0 pg   MCHC 34.4 30.0 - 36.0 g/dL   RDW 79.3 90.3 - 00.9 %   Platelets 265 150 - 400 K/uL   nRBC 0.0 0.0 - 0.2 %    Comment: Performed at [facility], [address] [place], [place] [address]  SARS Coronavirus 2 by RT PCR (hospital order, performed in [facility] hospital lab) Nasopharyngeal Nasopharyngeal Swab     Status: None   Collection Time: 06/27/20  3:37 PM   Specimen: Nasopharyngeal Swab  Result Value Ref Range   SARS Coronavirus 2 NEGATIVE NEGATIVE    Comment: (NOTE) SARS-CoV-2 target nucleic acids are NOT DETECTED.  The SARS-CoV-2 RNA is generally detectable in upper and lower respiratory specimens during the acute phase of infection. The lowest concentration of SARS-CoV-2 viral copies this assay can detect is 250 copies / mL. A negative  result does not preclude SARS-CoV-2 infection and should not be used as the sole basis for treatment or other patient management decisions.  A negative result may occur with improper specimen collection / handling, submission of specimen other than nasopharyngeal swab, presence of viral mutation(s) within the areas targeted by this assay, and inadequate number of viral copies (<250 copies / mL). A negative result must be combined with clinical observations, patient history, and epidemiological information.  Fact Sheet for Patients:   BoilerBrush.com.cy  Fact Sheet for Healthcare Providers: https://pope.com/  This test is not yet approved or  cleared by the [place] FDA and has been authorized for detection and/or diagnosis of SARS-CoV-2 by FDA under an Emergency Use Authorization (EUA).  This EUA will remain in effect (meaning this test can be used) for the duration of the COVID-19 declaration under Section 564(b)(1) of the Act, 21 U.S.C. section 360bbb-3(b)(1), unless the authorization is terminated or revoked sooner.  Performed at [facility], [address], [place], [place] [address]   Urine rapid drug screen (hosp performed)     Status: None   Collection Time: 06/27/20  6:00 PM  Result Value Ref Range   Opiates NONE DETECTED NONE DETECTED   Cocaine NONE DETECTED NONE DETECTED   Benzodiazepines NONE DETECTED NONE DETECTED   Amphetamines NONE DETECTED NONE DETECTED   Tetrahydrocannabinol NONE DETECTED NONE DETECTED   Barbiturates NONE DETECTED NONE DETECTED    Comment: (NOTE) DRUG SCREEN FOR MEDICAL PURPOSES ONLY.  IF CONFIRMATION IS NEEDED FOR ANY PURPOSE, NOTIFY LAB WITHIN 5 DAYS.  LOWEST DETECTABLE LIMITS FOR URINE DRUG SCREEN Drug Class                     Cutoff (ng/mL) Amphetamine and metabolites    1000 Barbiturate  and metabolites    200 Benzodiazepine                 200 Tricyclics and  metabolites     300 Opiates and metabolites        300 Cocaine and metabolites        300 THC                            50 Performed at [facility], [address] [place], [place] [address]     Medications:  Current Facility-Administered Medications  Medication Dose Route Frequency Provider Last Rate Last Admin   acetaminophen (TYLENOL) tablet 650 mg  650 mg Oral Q4H PRN Petrucelli, Samantha R, PA-C       alum & mag hydroxide-simeth (MAALOX/MYLANTA) 200-200-20 MG/5ML suspension 30 mL  30 mL Oral Q6H PRN Petrucelli, Samantha R, PA-C       haloperidol (HALDOL) tablet 5 mg  5 mg Oral Q8H PRN Ophelia ShoulderMills, Shnese E, NP       Or   haloperidol lactate (HALDOL) injection 5 mg  5 mg Intramuscular Q8H PRN Ophelia ShoulderMills, Shnese E, NP       ondansetron (ZOFRAN) tablet 4 mg  4 mg Oral Q8H PRN Petrucelli, Samantha R, PA-C       traZODone (DESYREL) tablet 50 mg  50 mg Oral QHS PRN Chales AbrahamsMills, Shnese E, NP       Current Outpatient Medications  Medication Sig Dispense Refill   valACYclovir (VALTREX) 1000 MG tablet Take 1 tablet at 4 PM today. (Patient not taking: Reported on 06/27/2020) 1 tablet 0    Musculoskeletal: Strength & Muscle Tone: within normal limits Gait & Station: normal Patient leans: N/A  Psychiatric Specialty Exam: Physical Exam Vitals and nursing note reviewed.  Constitutional:      Appearance: He is well-developed.  Cardiovascular:     Rate and Rhythm: Normal rate.  Pulmonary:     Effort: Pulmonary effort is normal.  Musculoskeletal:        General: Normal range of motion.  Skin:    General: Skin is warm.  Neurological:     Mental Status: He is alert and oriented to person, place, and time.     Review of Systems  Constitutional: Negative.   HENT: Negative.   Eyes: Negative.   Respiratory: Negative.   Cardiovascular: Negative.   Gastrointestinal: Negative.   Genitourinary: Negative.   Musculoskeletal: Negative.   Skin: Negative.   Neurological:  Negative.   Psychiatric/Behavioral: Negative.     Blood pressure 132/87, pulse 81, temperature 97.8 F (36.6 C), temperature source Oral, resp. rate 16, height 5\' 6"  (1.676 m), weight 77.1 kg, SpO2 100 %.Body mass index is 27.44 kg/m.  General Appearance: Casual  Eye Contact:  Good  Speech:  Clear and Coherent and Normal Rate  Volume:  Normal  Mood:  Euthymic  Affect:  Congruent  Thought Process:  Coherent and Descriptions of Associations: Intact  Orientation:  Full (Time, Place, and Person)  Thought Content:  WDL  Suicidal Thoughts:  No  Homicidal Thoughts:  No  Memory:  Immediate;   Good Recent;   Good Remote;   Good  Judgement:  Good  Insight:  Good  Psychomotor Activity:  Normal  Concentration:  Concentration: Good  Recall:  Good  Fund of Knowledge:  Good  Language:  Good  Akathisia:  No  Handed:  Right  AIMS (if indicated):  Assets:  Communication Skills Desire for Improvement Financial Resources/Insurance Housing Physical Health Social Support Transportation  ADL's:  Intact  Cognition:  WNL  Sleep:        Treatment Plan Summary: Provide resources for follwo up  Disposition: No evidence of imminent risk to self or others at present.   Patient does not meet criteria for psychiatric inpatient admission. Discussed crisis plan, support from social network, calling 911, coming to the Emergency Department, and calling Suicide Hotline.  This service was provided via telemedicine using a 2-way, interactive audio and video technology.  Names of all persons participating in this telemedicine service and their role in this encounter. Name: William Pineda Role: Patient  Name: Reola Calkins NP Role: Provider  Name:  Role:   Name:  Role:     Maryfrances Bunnell, FNP 06/29/2020 1:56 PM

## 2020-06-29 NOTE — ED Provider Notes (Signed)
Patient seen by psychiatry and cleared. I spoke with patient and he denies SI.   Lorre Nick, MD 06/29/20 1401

## 2020-06-29 NOTE — ED Notes (Signed)
Computer not loading e-signature.

## 2020-06-29 NOTE — BH Assessment (Addendum)
[facility] Assessment Progress Note  Per Reola Calkins, NP, this pt does not require psychiatric hospitalization at this time.  Pt presents under IVC initiated by law enforcement and upheld by EDP Gerhard Munch, MD, which has been rescinded by Nelly Rout, MD.  Pt is to be discharged from [facility] with referral information for [facility].  This has been included in pt's discharge instructions.  Pt's nurse has been notified.  Doylene Canning, MA Triage Specialist [phone]

## 2020-06-29 NOTE — Discharge Instructions (Signed)
For your behavioral health needs, you are advised to follow up with [facility].  Walk-in hours for new patients are Monday - Thursday from 8:00 am - 11:00 am.  Walk-in patients are seen on a first come, first served basis, so try to arrive as early as possible for the best chance of being seen the same day::       [facility]      [address]      [place], [place] [address]      [phone]

## 2020-09-07 ENCOUNTER — Encounter (HOSPITAL_COMMUNITY): Payer: Self-pay

## 2020-09-07 ENCOUNTER — Other Ambulatory Visit: Payer: Self-pay

## 2020-09-07 ENCOUNTER — Emergency Department (HOSPITAL_COMMUNITY)
Admission: EM | Admit: 2020-09-07 | Discharge: 2020-09-07 | Disposition: A | Discharge status: Home / Self Care | Payer: Self-pay | Attending: Emergency Medicine | Admitting: Emergency Medicine

## 2020-09-07 DIAGNOSIS — R21 Rash and other nonspecific skin eruption: Secondary | ICD-10-CM | POA: Insufficient documentation

## 2020-09-07 DIAGNOSIS — Z113 Encounter for screening for infections with a predominantly sexual mode of transmission: Secondary | ICD-10-CM | POA: Insufficient documentation

## 2020-09-07 MED ORDER — CETIRIZINE HCL 10 MG PO TABS: 10.0000 mg | ORAL_TABLET | Freq: Every day | ORAL | 0 refills | Status: AC

## 2020-09-07 MED ORDER — FAMOTIDINE 20 MG PO TABS: 20.0000 mg | ORAL_TABLET | Freq: Two times a day (BID) | ORAL | 0 refills | Status: AC

## 2020-09-07 NOTE — ED Triage Notes (Signed)
Pt sts he was sent here for an "A&A" test. Pt also sts sometimes he has itchy, raised area on arms, chest and face. Worst after wearing work uniform. None currently. Pt also requesting std check.

## 2020-09-07 NOTE — Discharge Instructions (Signed)
Take Zyrtec and Pepcid daily for the next week.  After this, stop taking medications and reassess your symptoms of rash. Follow-up with the allergy doctor hospital for further evaluation of your rash. You may also follow-up with a rheumatologist, although I recommend you start with allergy. Your testing for gonorrhea and chlamydia is pending.  If positive, you will receive a phone call.  If positive, you will need treatment at the health department. If negative, you will not receive a phone call.  Either way, you may check in on MyChart. Return to the emergency room with any new, worsening, concerning symptoms.

## 2020-09-07 NOTE — ED Provider Notes (Signed)
[facility]-EMERGENCY DEPT Provider Note   CSN: [id number] Arrival date & time: 09/07/20  1926     History Chief Complaint  Patient presents with  . Exposure to STD    William Pineda is a 34 y.o. male presenting for evaluation of multiple complaints.  Patient states of the past 2 weeks, he has had intermittent rash.  It occurs often without provocation.  It is more likely to happen if he is hot or in specific close.  When it comes on, is mostly on his extremities and back.  It itches.  When he tries to use cream, it does not help.  He has not taken anything for it including Benadryl.  He denies history of similar.  He is requesting "A&A" testing, but does not know what it is.  Additionally, patient is concerned that he may have an STD.  He reports a week of itching, but no penile drainage.  His last sexual encounter was several weeks ago.  The past 6 months, he states he has been sexually active with 1 partner who is male.  She is symptom-free.  They do not use condoms.  He denies nausea, vomiting, abdominal pain.  HPI     History reviewed. No pertinent past medical history.  Patient Active Problem List   Diagnosis Date Noted  . Adjustment disorder with mixed disturbance of emotions and conduct 06/28/2020    History reviewed. No pertinent surgical history.     No family history on file.  Social History   Tobacco Use  . Smoking status: Never Smoker  . Smokeless tobacco: Never Used  Substance Use Topics  . Alcohol use: Never  . Drug use: Never    Home Medications Prior to Admission medications   Medication Sig Start Date End Date Taking? Authorizing Provider  cetirizine (ZYRTEC) 10 MG tablet Take 1 tablet (10 mg total) by mouth daily. 09/07/20   Verlee Pope, PA-C  famotidine (PEPCID) 20 MG tablet Take 1 tablet (20 mg total) by mouth 2 (two) times daily. 09/07/20   Kainoah Bartosiewicz, PA-C  valACYclovir (VALTREX) 1000 MG tablet Take 1 tablet at 4  PM today. Patient not taking: Reported on 06/27/2020 03/22/18   Molpus, Jonny Ruiz, MD    Allergies    Patient has no known allergies.  Review of Systems   Review of Systems  Genitourinary:       Penile itching  Skin: Positive for rash.    Physical Exam Updated Vital Signs BP (!) 156/94 (BP Location: Left Arm)   Pulse 83   Temp 97.6 F (36.4 C) (Oral)   Resp 18   Ht 5\' 8"  (1.727 m)   Wt 81.6 kg   SpO2 98%   BMI 27.37 kg/m   Physical Exam Vitals and nursing note reviewed. Exam conducted with a chaperone present.  Constitutional:      General: He is not in acute distress.    Appearance: He is well-developed.     Comments: Sitting in the chair no acute distress  HENT:     Head: Normocephalic and atraumatic.  Eyes:     Extraocular Movements: Extraocular movements intact.     Conjunctiva/sclera: Conjunctivae normal.     Pupils: Pupils are equal, round, and reactive to light.  Cardiovascular:     Rate and Rhythm: Normal rate and regular rhythm.     Pulses: Normal pulses.  Pulmonary:     Effort: Pulmonary effort is normal. No respiratory distress.     Breath sounds:  Normal breath sounds. No wheezing.  Abdominal:     General: There is no distension.     Palpations: Abdomen is soft. There is no mass.     Tenderness: There is no abdominal tenderness. There is no guarding or rebound.  Genitourinary:    Penis: Circumcised.      Testes: Normal.     Epididymis:     Right: Normal.     Left: Normal.     Comments: No penile discharge.  No swelling or pain of the penis or testicles.  No lesions or ulcerations. Musculoskeletal:        General: Normal range of motion.     Cervical back: Normal range of motion and neck supple.  Lymphadenopathy:     Lower Body: No right inguinal adenopathy. No left inguinal adenopathy.  Skin:    General: Skin is warm and dry.     Capillary Refill: Capillary refill takes less than 2 seconds.  Neurological:     Mental Status: He is alert and oriented  to person, place, and time.     ED Results / Procedures / Treatments   Labs (all labs ordered are listed, but only abnormal results are displayed) Labs Reviewed  GC/CHLAMYDIA PROBE AMP ([place]) NOT AT [facility]    EKG None  Radiology No results found.  Procedures Procedures (including critical care time)  Medications Ordered in ED Medications - No data to display  ED Course  I have reviewed the triage vital signs and the nursing notes.  Pertinent labs & imaging results that were available during my care of the patient were reviewed by me and considered in my medical decision making (see chart for details).    MDM Rules/Calculators/A&P                          Patient presenting for evaluation of rash and concern for STD.  On exam, patient appears nontoxic.  No systemic signs of infection.  Rash is not present initially, but throughout the visit patient with scattered macular rash that comes and goes.  Often improved by touching, consider dermatographia.  Considering intermittent nature and itchiness, also consider histamine response. Will tx with pepcid and zyretc. Have pt f/u with allergy. The A&A he is requesting is likely ANA, which can be f/u with rheum.  GU exam overall nonconcerning for acute findings.  Gonorrhea and Chlamydia testing sent.  As patient is without classic symptoms, we will not treat at this time, however he is aware if he has a positive result he will need treatment at the health department.  At this time, patient appears safe for discharge.  Return precautions given.  Patient states he understands and agrees to plan.  Final Clinical Impression(s) / ED Diagnoses Final diagnoses:  Rash and nonspecific skin eruption  Screen for STD (sexually transmitted disease)    Rx / DC Orders ED Discharge Orders         Ordered    famotidine (PEPCID) 20 MG tablet  2 times daily        09/07/20 2113    cetirizine (ZYRTEC) 10 MG tablet  Daily        09/07/20 2113             Alveria Apley, PA-C 09/07/20 2131    Derwood Kaplan, MD 09/08/20 1614

## 2020-09-07 NOTE — ED Notes (Signed)
An After Visit Summary was printed and given to the patient. Discharge instructions given and no further questions at this time.  

## 2020-09-09 LAB — GC/CHLAMYDIA PROBE AMP (~~LOC~~) NOT AT ARMC
Chlamydia: NEGATIVE
Comment: NEGATIVE
Comment: NORMAL
Neisseria Gonorrhea: NEGATIVE

## 2021-04-14 ENCOUNTER — Other Ambulatory Visit: Payer: Self-pay

## 2021-04-14 ENCOUNTER — Emergency Department (HOSPITAL_COMMUNITY)
Admission: EM | Admit: 2021-04-14 | Discharge: 2021-04-15 | Disposition: A | Discharge status: Home / Self Care | Payer: BC Managed Care – PPO | Attending: Emergency Medicine | Admitting: Emergency Medicine

## 2021-04-14 DIAGNOSIS — B356 Tinea cruris: Secondary | ICD-10-CM | POA: Diagnosis not present

## 2021-04-14 LAB — URINALYSIS, ROUTINE W REFLEX MICROSCOPIC
Bilirubin Urine: NEGATIVE
Glucose, UA: NEGATIVE mg/dL
Hgb urine dipstick: NEGATIVE
Ketones, ur: NEGATIVE mg/dL
Leukocytes,Ua: NEGATIVE
Nitrite: NEGATIVE
Protein, ur: NEGATIVE mg/dL
Specific Gravity, Urine: 1.024 (ref 1.005–1.030)
pH: 5 (ref 5.0–8.0)

## 2021-04-14 LAB — HIV ANTIBODY (ROUTINE TESTING W REFLEX): HIV Screen 4th Generation wRfx: NONREACTIVE

## 2021-04-14 NOTE — ED Provider Notes (Signed)
Emergency Medicine Provider Triage Evaluation Note  Kaseem Vastine , a 35 y.o. male  was evaluated in triage.  Pt complains of discomfort, pruritis, and discoloration in genital region. He is currently sexually active with one partner without protection. No penile discharge. No testicular pain or edema. Denies abdominal pain. No fever or chills.   Review of Systems  Positive: Penile pain Negative: fever  Physical Exam  BP 124/90 (BP Location: Left Arm)   Pulse 79   Temp 98 F (36.7 C) (Oral)   Resp 16   SpO2 95%  Gen:   Awake, no distress   Resp:  Normal effort  MSK:   Moves extremities without difficulty  Other:  GU exam deferred in triage  Medical Decision Making  Medically screening exam initiated at 8:43 PM.  Appropriate orders placed.  Dani Danis was informed that the remainder of the evaluation will be completed by another provider, this initial triage assessment does not replace that evaluation, and the importance of remaining in the ED until their evaluation is complete.  STD panel (gonorrhea/chlamydia, HIV, syphilis). UA   Jesusita Oka 04/14/21 2045    Linwood Dibbles, MD 04/15/21 [phone]

## 2021-04-14 NOTE — ED Triage Notes (Signed)
Pt concerned for possible STD, states he is having discomfort, itching and discoloration to his genital area.

## 2021-04-15 LAB — RPR: RPR Ser Ql: NONREACTIVE

## 2021-04-15 MED ORDER — CLOTRIMAZOLE 1 % EX CREA: TOPICAL_CREAM | CUTANEOUS | 0 refills | Status: AC

## 2021-04-15 NOTE — ED Provider Notes (Signed)
[facility] EMERGENCY DEPARTMENT Provider Note   CSN: [id number] Arrival date & time: 04/14/21  2023     History Chief Complaint  Patient presents with   Exposure to STD    William Pineda is a 35 y.o. male.  HPI     This is a 35 year old male who presents with 1 week history of itching in his groin.  Patient has noted increased itching and discoloration of his groin over the last week.  He states he used some aloe vera which seems to help for a limited period of time but the itching came back.  He has 1 sexual partner.  They do not use condoms.  Unknown whether she has any symptoms.  He has not noted any ulcerative lesions.  No penile discharge, testicle pain, or edema.  Denies fevers.  Patient does workout and reports significant sweating in the groin.  No past medical history on file.  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     No family history on file.  Social History   Tobacco Use   Smoking status: Never   Smokeless tobacco: Never  Substance Use Topics   Alcohol use: Never   Drug use: Never    Home Medications Prior to Admission medications   Medication Sig Start Date End Date Taking? Authorizing Provider  clotrimazole (LOTRIMIN) 1 % cream Apply to affected area 2 times daily 04/15/21  Yes Harris Penton, Mayer Masker, MD  cetirizine (ZYRTEC) 10 MG tablet Take 1 tablet (10 mg total) by mouth daily. 09/07/20   Caccavale, Sophia, PA-C  famotidine (PEPCID) 20 MG tablet Take 1 tablet (20 mg total) by mouth 2 (two) times daily. 09/07/20   Caccavale, Sophia, PA-C  valACYclovir (VALTREX) 1000 MG tablet Take 1 tablet at 4 PM today. Patient not taking: Reported on 06/27/2020 03/22/18   Molpus, Jonny Ruiz, MD    Allergies    Patient has no known allergies.  Review of Systems   Review of Systems  Constitutional:  Negative for fever.   Genitourinary:  Negative for penile pain and penile swelling.  Skin:  Positive for color change and rash.  All other systems reviewed and are negative.  Physical Exam Updated Vital Signs BP 124/83   Pulse 66   Temp 98 F (36.7 C) (Oral)   Resp 20   SpO2 99%   Physical Exam Vitals and nursing note reviewed.  Constitutional:      Appearance: He is well-developed. He is not ill-appearing.  HENT:     Head: Normocephalic and atraumatic.     Nose: Nose normal.     Mouth/Throat:     Mouth: Mucous membranes are moist.  Eyes:     Pupils: Pupils are equal, round, and reactive to light.  Cardiovascular:     Rate and Rhythm: Normal rate and regular rhythm.  Pulmonary:     Effort: Pulmonary effort is normal. No respiratory distress.  Abdominal:     Palpations: Abdomen is soft.     Tenderness: There is no abdominal tenderness. There is no rebound.  Genitourinary:    Comments: Normal circumcised penis, no discharge noted, no penile or scrotal lesions, there is thickening and darkening discoloration of the bilateral upper thighs just adjacent to the perineum, no erythema Musculoskeletal:     Cervical back: Neck supple.  Lymphadenopathy:     Cervical: No cervical  adenopathy.  Skin:    General: Skin is warm and dry.  Neurological:     Mental Status: He is alert and oriented to person, place, and time.  Psychiatric:        Mood and Affect: Mood normal.    ED Results / Procedures / Treatments   Labs (all labs ordered are listed, but only abnormal results are displayed) Labs Reviewed  URINALYSIS, ROUTINE W REFLEX MICROSCOPIC  HIV ANTIBODY (ROUTINE TESTING W REFLEX)  RPR  GC/CHLAMYDIA PROBE AMP ([place]) NOT AT [facility]    EKG None  Radiology No results found.  Procedures Procedures   Medications Ordered in ED Medications - No data to display  ED Course  I have reviewed the triage vital signs and the nursing notes.  Pertinent labs & imaging results that were  available during my care of the patient were reviewed by me and considered in my medical decision making (see chart for details).    MDM Rules/Calculators/A&P                          Patient presents with itching bilateral thighs and groin.  He is overall nontoxic and vital signs are reassuring.  STD testing was sent from triage.  Urinalysis without evidence of UTI.  GC testing pending.  HIV negative and RPR pending.  Clinically, his symptoms and physical exam are most consistent with tinea infection or jock itch especially given itching.  Will recommend Lotrimin.  No evidence of ulcerative lesion to suggest herpes.  After history, exam, and medical workup I feel the patient has been appropriately medically screened and is safe for discharge home. Pertinent diagnoses were discussed with the patient. Patient was given return precautions.  Final Clinical Impression(s) / ED Diagnoses Final diagnoses:  Jock itch    Rx / DC Orders ED Discharge Orders          Ordered    clotrimazole (LOTRIMIN) 1 % cream        04/15/21 0112             Zyon Rosser, Mayer Masker, MD 04/15/21 [phone]

## 2021-04-15 NOTE — Discharge Instructions (Addendum)
You were seen today for groin itching.  Your physical exam is most consistent with jock itch.  Apply ointment twice daily.  STD testing is pending.

## 2021-05-27 DIAGNOSIS — Z131 Encounter for screening for diabetes mellitus: Secondary | ICD-10-CM | POA: Diagnosis not present

## 2021-05-27 DIAGNOSIS — F5221 Male erectile disorder: Secondary | ICD-10-CM | POA: Diagnosis not present

## 2021-05-27 DIAGNOSIS — Z13220 Encounter for screening for lipoid disorders: Secondary | ICD-10-CM | POA: Diagnosis not present

## 2021-05-27 DIAGNOSIS — L509 Urticaria, unspecified: Secondary | ICD-10-CM | POA: Diagnosis not present

## 2021-05-27 DIAGNOSIS — E559 Vitamin D deficiency, unspecified: Secondary | ICD-10-CM | POA: Diagnosis not present

## 2021-05-27 DIAGNOSIS — Z0000 Encounter for general adult medical examination without abnormal findings: Secondary | ICD-10-CM | POA: Diagnosis not present

## 2021-06-10 DIAGNOSIS — M545 Low back pain, unspecified: Secondary | ICD-10-CM | POA: Diagnosis not present

## 2021-06-10 DIAGNOSIS — L509 Urticaria, unspecified: Secondary | ICD-10-CM | POA: Diagnosis not present

## 2021-09-06 DIAGNOSIS — J3089 Other allergic rhinitis: Secondary | ICD-10-CM | POA: Diagnosis not present

## 2021-09-06 DIAGNOSIS — L501 Idiopathic urticaria: Secondary | ICD-10-CM | POA: Diagnosis not present

## 2021-10-20 DIAGNOSIS — H16212 Exposure keratoconjunctivitis, left eye: Secondary | ICD-10-CM | POA: Diagnosis not present

## 2023-04-19 ENCOUNTER — Emergency Department (HOSPITAL_COMMUNITY)
Admission: EM | Admit: 2023-04-19 | Discharge: 2023-04-19 | Disposition: A | Discharge status: Home / Self Care | Payer: BC Managed Care – PPO | Attending: Emergency Medicine | Admitting: Emergency Medicine

## 2023-04-19 ENCOUNTER — Other Ambulatory Visit: Payer: Self-pay

## 2023-04-19 ENCOUNTER — Emergency Department (HOSPITAL_COMMUNITY): Payer: BC Managed Care – PPO

## 2023-04-19 DIAGNOSIS — S76311A Strain of muscle, fascia and tendon of the posterior muscle group at thigh level, right thigh, initial encounter: Secondary | ICD-10-CM | POA: Insufficient documentation

## 2023-04-19 DIAGNOSIS — Y9389 Activity, other specified: Secondary | ICD-10-CM | POA: Diagnosis not present

## 2023-04-19 DIAGNOSIS — X58XXXA Exposure to other specified factors, initial encounter: Secondary | ICD-10-CM | POA: Insufficient documentation

## 2023-04-19 DIAGNOSIS — S86811A Strain of other muscle(s) and tendon(s) at lower leg level, right leg, initial encounter: Secondary | ICD-10-CM

## 2023-04-19 DIAGNOSIS — S8991XA Unspecified injury of right lower leg, initial encounter: Secondary | ICD-10-CM | POA: Diagnosis not present

## 2023-04-19 DIAGNOSIS — T1490XA Injury, unspecified, initial encounter: Secondary | ICD-10-CM | POA: Diagnosis not present

## 2023-04-19 DIAGNOSIS — S86911A Strain of unspecified muscle(s) and tendon(s) at lower leg level, right leg, initial encounter: Secondary | ICD-10-CM | POA: Insufficient documentation

## 2023-04-19 DIAGNOSIS — M79661 Pain in right lower leg: Secondary | ICD-10-CM | POA: Diagnosis not present

## 2023-04-19 DIAGNOSIS — M25561 Pain in right knee: Secondary | ICD-10-CM | POA: Diagnosis not present

## 2023-04-19 NOTE — ED Provider Notes (Signed)
[place] EMERGENCY DEPARTMENT AT [facility] Provider Note   CSN: [id number] Arrival date & time: 04/19/23  1617     History Chief Complaint  Patient presents with   Leg Pain    William Pineda is a 37 y.o. male.  Patient presents to the emergency department complaints of right leg pain.  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  States the pain has not been improving since then and denies any obvious bony deformity.  Able to bear weight on leg but difficulty with ambulation.  Denies prior history of clots.  Not on blood thinners.   Leg Pain      Home Medications Prior to Admission medications   Medication Sig Start Date End Date Taking? Authorizing Provider  cetirizine (ZYRTEC) 10 MG tablet Take 1 tablet (10 mg total) by mouth daily. 09/07/20   Caccavale, Sophia, PA-C  clotrimazole (LOTRIMIN) 1 % cream Apply to affected area 2 times daily 04/15/21   Horton, Mayer Masker, MD  famotidine (PEPCID) 20 MG tablet Take 1 tablet (20 mg total) by mouth 2 (two) times daily. 09/07/20   Caccavale, Sophia, PA-C  valACYclovir (VALTREX) 1000 MG tablet Take 1 tablet at 4 PM today. Patient not taking: Reported on 06/27/2020 03/22/18   Molpus, Jonny Ruiz, MD      Allergies    Patient has no known allergies.    Review of Systems   Review of Systems  Musculoskeletal:  Positive for gait problem.  All other systems reviewed and are negative.   Physical Exam Updated Vital Signs BP (!) 132/93 (BP Location: Right Arm)   Pulse 86   Temp 98.2 F (36.8 C) (Oral)   Resp 17   Ht 5\' 9"  (1.753 m)   Wt 95.1 kg   SpO2 99%   BMI 30.96 kg/m  Physical Exam Vitals and nursing note reviewed.  Constitutional:      General: He is not in acute distress.    Appearance: He is well-developed.  HENT:     Head: Normocephalic and atraumatic.  Eyes:      Conjunctiva/sclera: Conjunctivae normal.  Cardiovascular:     Rate and Rhythm: Normal rate and regular rhythm.     Heart sounds: No murmur heard. Pulmonary:     Effort: Pulmonary effort is normal. No respiratory distress.     Breath sounds: Normal breath sounds.  Abdominal:     Palpations: Abdomen is soft.     Tenderness: There is no abdominal tenderness.  Musculoskeletal:        General: Tenderness and signs of injury present. No swelling or deformity.     Cervical back: Neck supple.     Comments: This did range of motion diminished on right side and compared to left with knee flexion and knee extension.  No appreciable mass in this area but tenderness to palpation.  Skin:    General: Skin is warm and dry.     Capillary Refill: Capillary refill takes less than 2 seconds.  Neurological:     Mental Status: He is alert.  Psychiatric:        Mood and Affect: Mood normal.     ED Results / Procedures / Treatments   Labs (all labs ordered are listed, but only abnormal results are displayed) Labs Reviewed - No data to display  EKG None  Radiology DG Tibia/Fibula Right  Result Date: 04/19/2023 CLINICAL DATA:  Trauma, pain EXAM: RIGHT TIBIA AND FIBULA - 2 VIEW COMPARISON:  Radiographs-right knee done today FINDINGS: Proximal portions of tibia and fibula are included in the images of the right knee. No fracture or dislocation is seen. IMPRESSION: No fracture or dislocation is seen in right tibia and fibula. Electronically Signed   By: Ernie Avena M.D.   On: 04/19/2023 17:48   DG Femur 1 View Right  Result Date: 04/19/2023 CLINICAL DATA:  Trauma EXAM: RIGHT FEMUR 1 VIEW COMPARISON:  None Available. FINDINGS: AP views of right femur show no displaced fracture or dislocation. Evaluation is limited without a lateral view. IMPRESSION: No displaced fractures are seen in the AP views of right femur. Electronically Signed   By: Ernie Avena M.D.   On: 04/19/2023 17:47   DG Knee  2 Views Right  Result Date: 04/19/2023 CLINICAL DATA:  Pain, trauma 4 days ago EXAM: RIGHT KNEE - 1-2 VIEW COMPARISON:  None Available. FINDINGS: No evidence of fracture, dislocation, or joint effusion. Tiny bony spurs seen in patella. Soft tissues are unremarkable. IMPRESSION: No fracture or dislocation is seen in right knee. Electronically Signed   By: Ernie Avena M.D.   On: 04/19/2023 17:46    Procedures Procedures   Medications Ordered in ED Medications - No data to display  ED Course/ Medical Decision Making/ A&P                           Medical Decision Making  This patient presents to the ED for concern of leg pain.  Differential diagnosis includes Achilles tendon rupture, quadriceps tendon rupture, patellar dislocation, joint effusion, ACL tear   Imaging Studies ordered:  I ordered imaging studies including x-ray of right tib-fib, right femur, right knee I independently visualized and interpreted imaging which showed no obvious bony deformity I agree with the radiologist interpretation  Problem List / ED Course:  Patient presents to the emergency department complaints of right leg pain.  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  He denies inability to walk or bear weight on this but does report discomfort with any physical activity.  No prior history of any knee surgeries or any injuries.  Will evaluate patient with x-ray imaging for further evaluation of pain. Imaging negative for any fractures or dislocations.  Patient most likely sustained a muscular strain of this area as he notes significant pain and limited range of motion with resisted extension and flexion of the right lower extremity.  Advised patient he can take ibuprofen, Tylenol, Aleve as needed for pain.  Will have patient follow-up with primary care provider or return to the emergency department if symptoms are  worsening.  Given mechanism of injury and stability in the knee on testing, I do not believe the patient would benefit from knee brace or knee immobilizer at this time.  Low concern for occult fracture.  Advised patient to use ice and compression to reduce some swelling in the area as needed. Patient is agreeable with treatment plan and verbalized understanding all return precautions.  Final Clinical Impression(s) / ED Diagnoses Final diagnoses:  Strain of calf muscle, right, initial encounter  Strain of right hamstring, initial encounter    Rx / DC Orders ED Discharge Orders     None  Smitty Knudsen, PA-C 04/19/23 1837    Mardene Sayer, MD 04/20/23 1131

## 2023-04-19 NOTE — ED Triage Notes (Signed)
Pt reports pain to left lower leg states he jumped off a box 4 days ago and has been having pain since.

## 2023-04-19 NOTE — Discharge Instructions (Signed)
You are seen in the emergency department for leg pain.  Your extremity was negative for fractures or dislocations.  You most likely sustained a strain of the calf and hamstring muscles on the right leg.  I would advise limiting walking to only what is necessary for the next week or so to reduce repeated aggravation of the area.  You can take Tylenol, ibuprofen, Aleve for this.  As pain decreases, increase physical activity as tolerated.  If you have any acute worsening of your symptoms, please return to the emergency department.

## 2023-09-20 DIAGNOSIS — D485 Neoplasm of uncertain behavior of skin: Secondary | ICD-10-CM | POA: Diagnosis not present

## 2023-09-20 DIAGNOSIS — H0279 Other degenerative disorders of eyelid and periocular area: Secondary | ICD-10-CM | POA: Diagnosis not present

## 2023-10-18 DIAGNOSIS — H0279 Other degenerative disorders of eyelid and periocular area: Secondary | ICD-10-CM | POA: Diagnosis not present

## 2023-10-18 DIAGNOSIS — D485 Neoplasm of uncertain behavior of skin: Secondary | ICD-10-CM | POA: Diagnosis not present

## 2023-11-28 ENCOUNTER — Encounter (HOSPITAL_COMMUNITY): Payer: Self-pay | Admitting: *Deleted

## 2023-11-28 ENCOUNTER — Other Ambulatory Visit: Payer: Self-pay

## 2023-11-28 ENCOUNTER — Emergency Department (HOSPITAL_COMMUNITY)
Admission: EM | Admit: 2023-11-28 | Discharge: 2023-11-29 | Discharge status: Against Medical Advice | Payer: Self-pay | Attending: Emergency Medicine | Admitting: Emergency Medicine

## 2023-11-28 ENCOUNTER — Emergency Department (HOSPITAL_COMMUNITY): Payer: Self-pay

## 2023-11-28 DIAGNOSIS — R079 Chest pain, unspecified: Secondary | ICD-10-CM | POA: Insufficient documentation

## 2023-11-28 DIAGNOSIS — Z5321 Procedure and treatment not carried out due to patient leaving prior to being seen by health care provider: Secondary | ICD-10-CM | POA: Insufficient documentation

## 2023-11-28 DIAGNOSIS — R0602 Shortness of breath: Secondary | ICD-10-CM | POA: Diagnosis not present

## 2023-11-28 LAB — CBC
HCT: 43.6 % (ref 39.0–52.0)
Hemoglobin: 15.2 g/dL (ref 13.0–17.0)
MCH: 29.9 pg (ref 26.0–34.0)
MCHC: 34.9 g/dL (ref 30.0–36.0)
MCV: 85.8 fL (ref 80.0–100.0)
Platelets: 291 10*3/uL (ref 150–400)
RBC: 5.08 MIL/uL (ref 4.22–5.81)
RDW: 12.4 % (ref 11.5–15.5)
WBC: 8.2 10*3/uL (ref 4.0–10.5)
nRBC: 0 % (ref 0.0–0.2)

## 2023-11-28 LAB — BASIC METABOLIC PANEL
Anion gap: 10 (ref 5–15)
BUN: 9 mg/dL (ref 6–20)
CO2: 21 mmol/L — ABNORMAL LOW (ref 22–32)
Calcium: 9.2 mg/dL (ref 8.9–10.3)
Chloride: 105 mmol/L (ref 98–111)
Creatinine, Ser: 0.48 mg/dL — ABNORMAL LOW (ref 0.61–1.24)
GFR, Estimated: >60 mL/min (ref >60)
Glucose, Bld: 98 mg/dL (ref 70–99)
Potassium: 3.2 mmol/L — ABNORMAL LOW (ref 3.5–5.1)
Sodium: 136 mmol/L (ref 135–145)

## 2023-11-28 LAB — TROPONIN I (HIGH SENSITIVITY): Troponin I (High Sensitivity): <2 ng/L (ref <18)

## 2023-11-28 MED ORDER — OXYCODONE-ACETAMINOPHEN 5-325 MG PO TABS
1.0000 | ORAL_TABLET | Freq: Once | ORAL | Status: AC
  Administered 2023-11-28: 1 via ORAL
  Filled 2023-11-28: qty 1

## 2023-11-28 MED ORDER — ONDANSETRON 4 MG PO TBDP
4.0000 mg | ORAL_TABLET | Freq: Once | ORAL | Status: AC
  Administered 2023-11-28: 4 mg via ORAL
  Filled 2023-11-28: qty 1

## 2023-11-28 NOTE — ED Provider Triage Note (Signed)
Emergency Medicine Provider Triage Evaluation Note  William Pineda , a 38 y.o. male  was evaluated in triage.  Pt complains of chest pain, shortness of breath that started around an hour prior to arrival.  Patient reports similar symptoms around 5 years ago during death of his mother.  Denies alcohol, drug use.  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  Review of Systems  Positive: Chest pain, shortness of breath. Negative:   Physical Exam  BP 128/89   Pulse 85   Temp 98.1 F (36.7 C)   Resp (!) 24   SpO2 100%  Gen:   Awake, anxious, hyperventilating Resp:  No wheezing, rhonchi, stridor, rales MSK:   Moves extremities without difficulty  Other:    Medical Decision Making  Medically screening exam initiated at 8:53 PM.  Appropriate orders placed.  William Pineda was informed that the remainder of the evaluation will be completed by another provider, this initial triage assessment does not replace that evaluation, and the importance of remaining in the ED until their evaluation is complete.  Workup initiated in triage    Olene Floss, [place] 11/28/23 2055

## 2023-11-28 NOTE — ED Triage Notes (Signed)
Pt reports left sided chest "heaviness" that started about 1 hour ago. Associated with SOB

## 2023-11-29 LAB — TROPONIN I (HIGH SENSITIVITY): Troponin I (High Sensitivity): <2 ng/L (ref <18)

## 2023-11-29 NOTE — ED Notes (Signed)
Pt states his chest pain increased I let the triage nurse know I did another EKG and new set of vitals for him.

## 2023-11-29 NOTE — ED Notes (Signed)
Pt told registration he was leaving

## 2023-12-02 ENCOUNTER — Encounter (HOSPITAL_COMMUNITY): Payer: Self-pay | Admitting: *Deleted

## 2023-12-02 ENCOUNTER — Other Ambulatory Visit: Payer: Self-pay

## 2023-12-02 ENCOUNTER — Emergency Department (HOSPITAL_COMMUNITY)
Admission: EM | Admit: 2023-12-02 | Discharge: 2023-12-02 | Disposition: A | Discharge status: Home / Self Care | Payer: BC Managed Care – PPO | Attending: Emergency Medicine | Admitting: Emergency Medicine

## 2023-12-02 DIAGNOSIS — R55 Syncope and collapse: Secondary | ICD-10-CM | POA: Insufficient documentation

## 2023-12-02 DIAGNOSIS — Z20822 Contact with and (suspected) exposure to covid-19: Secondary | ICD-10-CM | POA: Insufficient documentation

## 2023-12-02 DIAGNOSIS — R0981 Nasal congestion: Secondary | ICD-10-CM | POA: Insufficient documentation

## 2023-12-02 DIAGNOSIS — E876 Hypokalemia: Secondary | ICD-10-CM | POA: Insufficient documentation

## 2023-12-02 LAB — RESP PANEL BY RT-PCR (RSV, FLU A&B, COVID)  RVPGX2
Influenza A by PCR: NEGATIVE
Influenza B by PCR: NEGATIVE
Resp Syncytial Virus by PCR: NEGATIVE
SARS Coronavirus 2 by RT PCR: NEGATIVE

## 2023-12-02 MED ORDER — POTASSIUM CHLORIDE CRYS ER 20 MEQ PO TBCR: 20.0000 meq | EXTENDED_RELEASE_TABLET | Freq: Two times a day (BID) | ORAL | 0 refills | Status: AC

## 2023-12-02 NOTE — ED Triage Notes (Addendum)
Returns after eloping after triage 1/28. Studies/ labs done at that time, but left before being seen. Here for work note, needs to be cleared for work. Was seen initially for syncope, chest heaviness and sob. Sx resolved. Reports only sx of nasal congestion/ runny nose at this time. Alert, NAD, calm, interactive, resps e/u, speaking clearly, steady gait.

## 2023-12-02 NOTE — ED Provider Notes (Signed)
[place] EMERGENCY DEPARTMENT AT [facility] Provider Note   CSN: [id number] Arrival date & time: 12/02/23  2841     History  Chief Complaint  Patient presents with   Nasal Congestion    William Pineda is a 38 y.o. male otherwise healthy presents with complaints of syncopal episode on 1/28.  Patient states he went from sitting to standing initially felt chest heaviness shortness of breath before passing out.  States a Consulting civil engineer called him and he did not his head.  Denies any prior syncopal episodes.  Denies any cardiac issues or history of blood clots.  He is currently without any chest pain or shortness of breath.  Reports that he is able to climb stairs without any difficulty however when he really exerts himself playing basketball he may feel short of breath.  Today he does note some nasal congestion.  Otherwise he is without complaints.  States he just needs a note for work. HPI   History reviewed. No pertinent past medical history.   Home Medications Prior to Admission medications   Medication Sig Start Date End Date Taking? Authorizing Provider  cetirizine (ZYRTEC) 10 MG tablet Take 1 tablet (10 mg total) by mouth daily. 09/07/20   Caccavale, Sophia, PA-C  clotrimazole (LOTRIMIN) 1 % cream Apply to affected area 2 times daily 04/15/21   Horton, Mayer Masker, MD  famotidine (PEPCID) 20 MG tablet Take 1 tablet (20 mg total) by mouth 2 (two) times daily. 09/07/20   Caccavale, Sophia, PA-C  valACYclovir (VALTREX) 1000 MG tablet Take 1 tablet at 4 PM today. Patient not taking: Reported on 06/27/2020 03/22/18   Molpus, Jonny Ruiz, MD      Allergies    Patient has no known allergies.    Review of Systems   Review of Systems  Neurological:  Positive for syncope.    Physical Exam Updated Vital Signs BP (!) 129/92 (BP Location: Left Arm)   Pulse 79   Temp 99.5 F (37.5 C) (Oral)   Resp 17   Ht 5\' 9"  (1.753 m)   Wt 99.8 kg   SpO2 100%   BMI 32.49 kg/m  Physical Exam Vitals  and nursing note reviewed.  Constitutional:      General: He is not in acute distress.    Appearance: He is well-developed.  HENT:     Head: Normocephalic and atraumatic.  Eyes:     Conjunctiva/sclera: Conjunctivae normal.  Cardiovascular:     Rate and Rhythm: Normal rate and regular rhythm.     Heart sounds: No murmur heard. Pulmonary:     Effort: Pulmonary effort is normal. No respiratory distress.     Breath sounds: Normal breath sounds. No wheezing or rales.  Abdominal:     Palpations: Abdomen is soft.     Tenderness: There is no abdominal tenderness.  Musculoskeletal:        General: No swelling.     Cervical back: Neck supple.  Skin:    General: Skin is warm and dry.     Capillary Refill: Capillary refill takes less than 2 seconds.  Neurological:     General: No focal deficit present.     Mental Status: He is alert and oriented to person, place, and time.     Cranial Nerves: No cranial nerve deficit.     Sensory: No sensory deficit.     Motor: No weakness.     Coordination: Coordination normal.     Gait: Gait normal.  Comments: No aphasia or abnormal speech, no facial droop, EOMI, PERRL  Psychiatric:        Mood and Affect: Mood normal.     ED Results / Procedures / Treatments   Labs (all labs ordered are listed, but only abnormal results are displayed) Labs Reviewed  RESP PANEL BY RT-PCR (RSV, FLU A&B, COVID)  [id number]    EKG EKG Interpretation Date/Time:  Saturday December 02 2023 14:07:40 EST Ventricular Rate:  84 PR Interval:  140 QRS Duration:  71 QT Interval:  364 QTC Calculation: 431 R Axis:   60  Text Interpretation: Sinus rhythm ST elev, probable normal early repol pattern Confirmed by Alvester Chou [phone]) on 12/02/2023 2:18:03 PM  Radiology No results found.  Procedures Procedures    Medications Ordered in ED Medications - No data to display  ED Course/ Medical Decision Making/ A&P                                 Medical Decision  Making  This patient presents to the ED with chief complaint(s) of syncope.  The complaint involves an extensive differential diagnosis and also carries with it a high risk of complications and morbidity.   pertinent past medical history as listed in HPI  The differential diagnosis includes  Arrhythmia, vasovagal, electrolyte abnormality, dehydration, infection, CVA, TIA, URI, mass effect, hypoglycemia, seizure, URI, ACS, PE  Additional history obtained:  Records reviewed previous admission documents and Care Everywhere/External Records  Initial Assessment:   Hemodynamically stable, afebrile, nontoxic-appearing patient presenting for syncopal episode on 1/28.  He presented to this facility but left without being seen.  His troponins were without elevation.  His EKG showed sinus rhythm with early repol, he did have mild hypokalemia of 3.2, his glucose was within normal limits.  He is asymptomatic today.  His neuroexam is without any deficits.  He has no history of cardiac issues.  He does report some nasal congestion today otherwise he is without complaints I would like a work note Independent ECG interpretation:  Sinus rhythm with early repolarization  Independent labs interpretation:  The following labs were independently interpreted:  Reviewed labs from 1/28  Independent visualization and interpretation of imaging: none  Treatment and Reassessment: No medications administered during this visit  Consultations obtained:   None  Disposition:   Patient will be discharged home.  Provided note to return to work.  Encouraged to avoid exertion at this time.  Provided cardiology follow-up.  Additionally provided PCP resources to establish care. The patient has been appropriately medically screened and/or stabilized in the ED. I have low suspicion for any other emergent medical condition which would require further screening, evaluation or treatment in the ED or require inpatient management. At  time of discharge the patient is hemodynamically stable and in no acute distress. I have discussed work-up results and diagnosis with patient and answered all questions. Patient is agreeable with discharge plan. We discussed strict return precautions for returning to the emergency department and they verbalized understanding.     Social Determinants of Health:   none  This note was dictated with voice recognition software.  Despite best efforts at proofreading, errors may have occurred which can change the documentation meaning.          Final Clinical Impression(s) / ED Diagnoses Final diagnoses:  Nasal congestion  Syncope, unspecified syncope type    Rx / DC Orders ED Discharge Orders  None         Fabienne Bruns 12/02/23 1447    Wynetta Fines, MD 12/02/23 2115

## 2023-12-02 NOTE — Discharge Instructions (Signed)
You were evaluated in the emergency room for syncopal episode on 1/28 as well as nasal congestion today.  Your respiratory panel today was negative.  Your EKG did not show any acute abnormality.  Your lab work from 1/28 did show mildly low potassium otherwise did not show any significant abnormality.  A prescription for potassium supplementation was sent in for the next few days.  You were additionally provided a referral to follow-up with cardiology as well as resources to establish with a primary care doctor.  You experience any new or worsening symptoms including recurrent syncopal episodes, worsening chest pain shortness of breath please return to the emergency room.  As discussed please avoid exertion until following up with cardiology.

## 2024-01-25 ENCOUNTER — Emergency Department (HOSPITAL_BASED_OUTPATIENT_CLINIC_OR_DEPARTMENT_OTHER)
Admission: EM | Admit: 2024-01-25 | Discharge: 2024-01-25 | Disposition: A | Discharge status: Home / Self Care | Attending: Emergency Medicine | Admitting: Emergency Medicine

## 2024-01-25 ENCOUNTER — Other Ambulatory Visit: Payer: Self-pay

## 2024-01-25 ENCOUNTER — Encounter (HOSPITAL_BASED_OUTPATIENT_CLINIC_OR_DEPARTMENT_OTHER): Payer: Self-pay | Admitting: Emergency Medicine

## 2024-01-25 DIAGNOSIS — R55 Syncope and collapse: Secondary | ICD-10-CM | POA: Diagnosis not present

## 2024-01-25 DIAGNOSIS — R42 Dizziness and giddiness: Secondary | ICD-10-CM | POA: Insufficient documentation

## 2024-01-25 DIAGNOSIS — R5383 Other fatigue: Secondary | ICD-10-CM | POA: Insufficient documentation

## 2024-01-25 LAB — COMPREHENSIVE METABOLIC PANEL WITH GFR
ALT: 12 U/L (ref 0–44)
AST: 20 U/L (ref 15–41)
Albumin: 4.3 g/dL (ref 3.5–5.0)
Alkaline Phosphatase: 60 U/L (ref 38–126)
Anion gap: 6 (ref 5–15)
BUN: 11 mg/dL (ref 6–20)
CO2: 29 mmol/L (ref 22–32)
Calcium: 9.4 mg/dL (ref 8.9–10.3)
Chloride: 105 mmol/L (ref 98–111)
Creatinine, Ser: 0.78 mg/dL (ref 0.61–1.24)
GFR, Estimated: >60 mL/min (ref >60)
Glucose, Bld: 90 mg/dL (ref 70–99)
Potassium: 4.1 mmol/L (ref 3.5–5.1)
Sodium: 140 mmol/L (ref 135–145)
Total Bilirubin: 0.5 mg/dL (ref 0.0–1.2)
Total Protein: 7.1 g/dL (ref 6.5–8.1)

## 2024-01-25 LAB — CBC WITH DIFFERENTIAL/PLATELET
Abs Immature Granulocytes: 0 10*3/uL (ref 0.00–0.07)
Basophils Absolute: 0.1 10*3/uL (ref 0.0–0.1)
Basophils Relative: 1 %
Eosinophils Absolute: 0.6 10*3/uL — ABNORMAL HIGH (ref 0.0–0.5)
Eosinophils Relative: 10 %
HCT: 45.2 % (ref 39.0–52.0)
Hemoglobin: 15.4 g/dL (ref 13.0–17.0)
Immature Granulocytes: 0 %
Lymphocytes Relative: 50 %
Lymphs Abs: 3 10*3/uL (ref 0.7–4.0)
MCH: 29.8 pg (ref 26.0–34.0)
MCHC: 34.1 g/dL (ref 30.0–36.0)
MCV: 87.4 fL (ref 80.0–100.0)
Monocytes Absolute: 0.4 10*3/uL (ref 0.1–1.0)
Monocytes Relative: 7 %
Neutro Abs: 1.9 10*3/uL (ref 1.7–7.7)
Neutrophils Relative %: 32 %
Platelets: 268 10*3/uL (ref 150–400)
RBC: 5.17 MIL/uL (ref 4.22–5.81)
RDW: 12.6 % (ref 11.5–15.5)
WBC: 5.9 10*3/uL (ref 4.0–10.5)
nRBC: 0 % (ref 0.0–0.2)

## 2024-01-25 LAB — RESP PANEL BY RT-PCR (RSV, FLU A&B, COVID)  RVPGX2
Influenza A by PCR: NEGATIVE
Influenza B by PCR: NEGATIVE
Resp Syncytial Virus by PCR: NEGATIVE
SARS Coronavirus 2 by RT PCR: NEGATIVE

## 2024-01-25 LAB — TSH: TSH: 0.935 u[IU]/mL (ref 0.350–4.500)

## 2024-01-25 NOTE — ED Provider Notes (Signed)
 [place] EMERGENCY DEPARTMENT AT [facility] Provider Note   CSN: [id number] Arrival date & time: 01/25/24  1008     History  Chief Complaint  Patient presents with   Fatigue    William Pineda is a 38 y.o. male otherwise healthy presents with complaints of profound fatigue and dizziness.  Patient states last time he felt like this he had a syncopal episode.  States woke up in the hospital and was discharged home with cardiology follow-up of which he lost contact.  Denies any chest pain, shortness of breath.  No headache blurry vision or extremity weakness.  He is able to ambulate without difficulty.  States he will sleep approximately 15 hours and still feel fatigued.  No cough or congestion.  HPI  No past medical history on file.     Home Medications Prior to Admission medications   Medication Sig Start Date End Date Taking? Authorizing Provider  cetirizine (ZYRTEC) 10 MG tablet Take 1 tablet (10 mg total) by mouth daily. 09/07/20   Caccavale, Sophia, PA-C  clotrimazole (LOTRIMIN) 1 % cream Apply to affected area 2 times daily 04/15/21   Horton, Mayer Masker, MD  famotidine (PEPCID) 20 MG tablet Take 1 tablet (20 mg total) by mouth 2 (two) times daily. 09/07/20   Caccavale, Sophia, PA-C  potassium chloride SA (KLOR-CON M) 20 MEQ tablet Take 1 tablet (20 mEq total) by mouth 2 (two) times daily. 12/02/23   Halford Decamp, PA-C  valACYclovir (VALTREX) 1000 MG tablet Take 1 tablet at 4 PM today. Patient not taking: Reported on 06/27/2020 03/22/18   Molpus, Jonny Ruiz, MD      Allergies    Patient has no known allergies.    Review of Systems   Review of Systems  Constitutional:  Positive for fatigue.    Physical Exam Updated Vital Signs BP (!) 131/92 (BP Location: Right Arm)   Pulse 65   Temp 98.5 F (36.9 C) (Oral)   Resp 16   Wt 99.8 kg   SpO2 100%   BMI 32.49 kg/m  Physical Exam Vitals and nursing note reviewed.  Constitutional:      General: He is not in acute  distress.    Appearance: He is well-developed.  HENT:     Head: Normocephalic and atraumatic.  Eyes:     Conjunctiva/sclera: Conjunctivae normal.  Cardiovascular:     Rate and Rhythm: Normal rate and regular rhythm.     Heart sounds: No murmur heard. Pulmonary:     Effort: Pulmonary effort is normal. No respiratory distress.     Breath sounds: Normal breath sounds.  Abdominal:     Palpations: Abdomen is soft.     Tenderness: There is no abdominal tenderness.  Musculoskeletal:        General: No swelling.     Cervical back: Neck supple.  Skin:    General: Skin is warm and dry.     Capillary Refill: Capillary refill takes less than 2 seconds.  Neurological:     Mental Status: He is alert.     Comments: Patient is alert and oriented. There is no abnormal phonation. Symmetric smile without facial droop.  Moves all extremities spontaneously. 5/5 strength in upper and lower extremities. . No sensation deficit. There is no nystagmus. EOMI, PERRL. Coordination intact with finger to nose and normal ambulation.    Psychiatric:        Mood and Affect: Mood normal.     ED Results / Procedures / Treatments  Labs (all labs ordered are listed, but only abnormal results are displayed) Labs Reviewed  CBC WITH DIFFERENTIAL/PLATELET - Abnormal; Notable for the following components:      Result Value   Eosinophils Absolute 0.6 (*)    All other components within normal limits  RESP PANEL BY RT-PCR (RSV, FLU A&B, COVID)  RVPGX2  COMPREHENSIVE METABOLIC PANEL WITH GFR  TSH    EKG EKG Interpretation Date/Time:  Thursday January 25 2024 10:15:39 EDT Ventricular Rate:  74 PR Interval:  128 QRS Duration:  78 QT Interval:  362 QTC Calculation: 401 R Axis:   57  Text Interpretation: Normal sinus rhythm Normal ECG When compared with ECG of 02-Dec-2023 14:07, PREVIOUS ECG IS PRESENT Confirmed by Edwin Dada ([id number]) on 01/25/2024 2:23:34 PM  Radiology No results found.  Procedures Procedures     Medications Ordered in ED Medications - No data to display  ED Course/ Medical Decision Making/ A&P                                 Medical Decision Making Amount and/or Complexity of Data Reviewed Labs: ordered.   This patient presents to the ED with chief complaint(s) of fatigue and dizziness.  The complaint involves an extensive differential diagnosis and also carries with it a high risk of complications and morbidity.   pertinent past medical history as listed in HPI  The differential diagnosis includes  URI, electrolyte abnormality, hypothyroidism, cardiac etiology The initial plan is to  Obtain basic labs and EKG Additional history obtained: Records reviewed Care Everywhere/External Records  Initial Assessment:   Hemodynamically stable, nontoxic-appearing patient presenting with fatigue and dizziness described as near syncope.  1 prior episode of syncopal event on 1/28.  Discharged home with cardiology follow-up.  Did not end up pursuing this.  He has no chest pain or shortness of breath to suggest ACS or PE.  He has no neurodeficits on exam to suggest CVA or TIA.  No cough, congestion or vomiting to suggest URI.  Will obtain basic labs, EKG.  Independent ECG interpretation:  Normal sinus rhythm  Independent labs interpretation:  The following labs were independently interpreted:  CBC without significant abnormality, CMP unremarkable, respiratory panel negative, TSH within normal limits  Independent visualization and interpretation of imaging: none  Treatment and Reassessment: No medications administered during visit  Consultations obtained:   none  Disposition:   Patient will be discharged home.  Provided referral for cardiology and contact information to establish with PCP. The patient has been appropriately medically screened and/or stabilized in the ED. I have low suspicion for any other emergent medical condition which would require further screening,  evaluation or treatment in the ED or require inpatient management. At time of discharge the patient is hemodynamically stable and in no acute distress. I have discussed work-up results and diagnosis with patient and answered all questions. Patient is agreeable with discharge plan. We discussed strict return precautions for returning to the emergency department and they verbalized understanding.     Social Determinants of Health:   none  This note was dictated with voice recognition software.  Despite best efforts at proofreading, errors may have occurred which can change the documentation meaning.          Final Clinical Impression(s) / ED Diagnoses Final diagnoses:  Other fatigue  Near syncope    Rx / DC Orders ED Discharge Orders     None  Halford Decamp, PA-C 01/25/24 1543    Franne Forts, DO 01/26/24 [phone]

## 2024-01-25 NOTE — Discharge Instructions (Addendum)
 You were evaluated in the emergency room for fatigue and dizziness.  Your lab work EKG did not show any significant abnormality.  You are provided resources to establish with a primary care doctor and additionally provided a referral for cardiologist.

## 2024-01-25 NOTE — ED Triage Notes (Addendum)
 Pt endorses "feel tired" today. Denies shob, reports intermittent dizziness

## 2024-05-01 ENCOUNTER — Ambulatory Visit: Attending: Cardiology | Admitting: Cardiology

## 2024-05-01 NOTE — Progress Notes (Deleted)
  Cardiology Office Note:  .   Date:  05/01/2024  ID:  William Pineda, DOB 1986/02/08, MRN [id number] PCP: Patient, No Pcp Per  [facility] Providers Cardiologist:  None { Click to update primary MD,subspecialty MD or APP then REFRESH:1}    No chief complaint on file.   Patient Profile: .     William Pineda is a *** 38 y.o. male *** with a PMH notable for *** who presents here for *** at the request of William Lynwood DEL, PA-C.  {There is no content from the last Narrative History section.}      William Pineda was last seen on ***  ER visit 01/25/2024 for near syncope (it also had syncope on February 1) 12/02/2023: ER presentation after syncopal episode on January 28.) standing, felt just heaviness and dyspnea and then passed out.  No further chest pain.  No exertional dyspnea.  Only dyspnea with vigorous exertion.  No aphasia or stroke symptoms.  He stated he woke up in the hospital and was discharged with cardiology follow-up. 01/26/2024: [facility] with profound fatigue and dizziness.  Similar to prodrome of his syncopal episode.  Noted fatigue after sleeping 15 hours.  Ruled out for TIA/CVA.  Subjective  Discussed the use of AI scribe software for clinical note transcription with the patient, who gave verbal consent to proceed.  History of Present Illness      Cardiovascular ROS: {roscv:310661}  ROS:  Review of Systems - {ros master:310782}    Objective    Studies Reviewed: SABRA        ECHO: *** CATH: *** MONITOR: *** CT: ***  Risk Assessment/Calculations:   {Does this patient have ATRIAL FIBRILLATION?:[phone]} No BP recorded.  {Refresh Note OR Click here to enter BP  :1}***         Physical Exam:   VS:  There were no vitals taken for this visit.   Wt Readings from Last 3 Encounters:  01/25/24 220 lb (99.8 kg)  12/02/23 220 lb (99.8 kg)  04/19/23 209 lb 10.5 oz (95.1 kg)    GEN: Well nourished, well developed in no acute distress;  *** NECK: No JVD; No carotid bruits CARDIAC: Normal S1, S2; RRR, no murmurs, rubs, gallops RESPIRATORY:  Clear to auscultation without rales, wheezing or rhonchi ; nonlabored, good air movement. ABDOMEN: Soft, non-tender, non-distended EXTREMITIES:  No edema; No deformity      ASSESSMENT AND PLAN: .    Problem List Items Addressed This Visit   None   Assessment and Plan Assessment & Plan        {Are you ordering a CV Procedure (e.g. stress test, cath, DCCV, TEE, etc)?   Press F2        :[id number]}   Follow-Up: No follow-ups on file.  Total time spent: *** min spent with patient + *** min spent charting = *** min    Signed, [name], MD, MS [name], M.D., M.S. Interventional Chartered certified accountant  Pager # [phone]

## 2024-05-02 ENCOUNTER — Encounter: Payer: Self-pay | Admitting: Cardiology

## 2024-06-07 ENCOUNTER — Other Ambulatory Visit: Payer: Self-pay

## 2024-06-07 ENCOUNTER — Emergency Department (HOSPITAL_COMMUNITY)
Admission: EM | Admit: 2024-06-07 | Discharge: 2024-06-07 | Disposition: A | Discharge status: Home / Self Care | Attending: Emergency Medicine | Admitting: Emergency Medicine

## 2024-06-07 DIAGNOSIS — K649 Unspecified hemorrhoids: Secondary | ICD-10-CM | POA: Diagnosis present

## 2024-06-07 MED ORDER — POLYETHYLENE GLYCOL 3350 17 G PO PACK: 17.0000 g | PACK | Freq: Every day | ORAL | 0 refills | Status: AC

## 2024-06-07 MED ORDER — LIDOCAINE 5 % EX OINT: 1.0000 | TOPICAL_OINTMENT | CUTANEOUS | 0 refills | Status: AC | PRN

## 2024-06-07 MED ORDER — KETOROLAC TROMETHAMINE 15 MG/ML IJ SOLN
30.0000 mg | Freq: Once | INTRAMUSCULAR | Status: AC
  Administered 2024-06-07: 30 mg via INTRAMUSCULAR
  Filled 2024-06-07: qty 2

## 2024-06-07 NOTE — ED Provider Notes (Signed)
  EMERGENCY DEPARTMENT AT [facility] Provider Note   CSN: [id number] Arrival date & time: 06/07/24  9964     History Chief Complaint  Patient presents with   Hemorrhoids    HPI William Pineda is a 38 y.o. male presenting for chief complaint of hemorrhoids.   Patient's recorded medical, surgical, social, medication list and allergies were reviewed in the Snapshot window as part of the initial history.   Review of Systems   Review of Systems  Constitutional:  Negative for chills and fever.  HENT:  Negative for ear pain and sore throat.   Eyes:  Negative for pain and visual disturbance.  Respiratory:  Negative for cough and shortness of breath.   Cardiovascular:  Negative for chest pain and palpitations.  Gastrointestinal:  Positive for rectal pain. Negative for abdominal pain and vomiting.  Genitourinary:  Negative for dysuria and hematuria.  Musculoskeletal:  Negative for arthralgias and back pain.  Skin:  Negative for color change and rash.  Neurological:  Negative for seizures and syncope.  All other systems reviewed and are negative.   Physical Exam Updated Vital Signs BP 122/85   Pulse 60   Temp 98.1 F (36.7 C)   Resp 16   Ht 5' 9 (1.753 m)   Wt 99.8 kg   SpO2 97%   BMI 32.49 kg/m  Physical Exam Vitals and nursing note reviewed.  Constitutional:      General: He is not in acute distress.    Appearance: He is well-developed.  HENT:     Head: Normocephalic and atraumatic.  Eyes:     Conjunctiva/sclera: Conjunctivae normal.  Cardiovascular:     Rate and Rhythm: Normal rate and regular rhythm.     Heart sounds: No murmur heard. Pulmonary:     Effort: Pulmonary effort is normal. No respiratory distress.     Breath sounds: Normal breath sounds.  Abdominal:     Palpations: Abdomen is soft.     Tenderness: There is no abdominal tenderness.  Genitourinary:    Comments: Large hemorrhoid without bleeding Musculoskeletal:        General: No  swelling.     Cervical back: Neck supple.  Skin:    General: Skin is warm and dry.     Capillary Refill: Capillary refill takes less than 2 seconds.  Neurological:     Mental Status: He is alert.  Psychiatric:        Mood and Affect: Mood normal.      ED Course/ Medical Decision Making/ A&P    Procedures Procedures   Medications Ordered in ED Medications  ketorolac  (TORADOL ) 15 MG/ML injection 30 mg (30 mg Intramuscular Given 06/07/24 0232)    Medical Decision Making:   Chief complaint of hemorrhoids. No fisure or fistula. No acute distress. Discussed supportive care.  No evidence of active hemorrhage or large thrombosis.  Recommended follow-up with general surgery if not improving with supportive care.  Clinical Impression:  1. Hemorrhoids, unspecified hemorrhoid type      Discharge   Final Clinical Impression(s) / ED Diagnoses Final diagnoses:  Hemorrhoids, unspecified hemorrhoid type    Rx / DC Orders ED Discharge Orders          Ordered    lidocaine  (XYLOCAINE ) 5 % ointment  As needed        06/07/24 0219    polyethylene glycol (MIRALAX ) 17 g packet  Daily        06/07/24 0219  Jerral Meth, MD 06/07/24 [phone]

## 2024-06-07 NOTE — ED Triage Notes (Signed)
 Pt believes he has hemmorrhoids. Has used otc meds without relief. No blood in stool. Rectum is painful

## 2024-07-29 NOTE — Progress Notes (Deleted)
  Cardiology Office Note:  .    Date:  07/29/2024  ID:  William Pineda, DOB 1985/12/09, MRN [id number] PCP: Patient, No Pcp Per  [facility] Providers Cardiologist:  None { Click to update primary MD,subspecialty MD or APP then REFRESH:1}    CC: *** Consulted for the evaluation of *** at the behest of ***   History of Present Illness: .    William Pineda is a 37 y.o. male ***  OH- near syncope Multiple ED eval  OH vitals and full work up then PRN  Discussed the use of AI scribe software for clinical note transcription with the patient, who gave verbal consent to proceed.   Relevant histories: .  Social *** ROS: As per HPI.   Studies Reviewed: .           Risk Assessment/Calculations:    {Does this patient have ATRIAL FIBRILLATION?[phone]}       Physical Exam:    VS:  There were no vitals taken for this visit.   Wt Readings from Last 3 Encounters:  06/07/24 220 lb 0.3 oz (99.8 kg)  01/25/24 220 lb (99.8 kg)  12/02/23 220 lb (99.8 kg)    Gen: *** distress, *** obese/well nourished/malnourished   Neck: No JVD, *** carotid bruit Ears: *** Frank Sign Cardiac: No Rubs or Gallops, *** Murmur, ***cardia, *** radial pulses Respiratory: Clear to auscultation bilaterally, *** effort, ***  respiratory rate GI: Soft, nontender, non-distended *** MS: No *** edema; *** moves all extremities Integument: Skin feels *** Neuro:  At time of evaluation, alert and oriented to person/place/time/situation *** Psych: Normal affect, patient feels ***   ASSESSMENT AND PLAN: .    *** An EKG was ordered for *** and shows ***  Stanly Leavens, MD FASE [facility] Cardiologist [facility]  [address] [place], [place] [address] [phone]  7:42 AM

## 2024-07-30 ENCOUNTER — Ambulatory Visit: Attending: Internal Medicine | Admitting: Internal Medicine

## 2024-07-31 ENCOUNTER — Encounter: Payer: Self-pay | Admitting: Internal Medicine

## 2024-08-14 ENCOUNTER — Encounter (HOSPITAL_BASED_OUTPATIENT_CLINIC_OR_DEPARTMENT_OTHER): Payer: Self-pay
